# Patient Record
Sex: Male | Born: 1995 | Race: White | Hispanic: No | Marital: Single | Smoking: Current every day smoker
Health system: Southern US, Community
[De-identification: ages and names within clinical notes are randomized; demographics above are authoritative.]

## PROBLEM LIST (undated history)

## (undated) DIAGNOSIS — F329 Major depressive disorder, single episode, unspecified: Secondary | ICD-10-CM

## (undated) DIAGNOSIS — F909 Attention-deficit hyperactivity disorder, unspecified type: Secondary | ICD-10-CM

## (undated) DIAGNOSIS — F32A Depression, unspecified: Secondary | ICD-10-CM

## (undated) DIAGNOSIS — F913 Oppositional defiant disorder: Secondary | ICD-10-CM

## (undated) HISTORY — DX: Major depressive disorder, single episode, unspecified: F32.9

## (undated) HISTORY — DX: Depression, unspecified: F32.A

## (undated) HISTORY — DX: Attention-deficit hyperactivity disorder, unspecified type: F90.9

## (undated) HISTORY — DX: Oppositional defiant disorder: F91.3

---
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
## 2016-06-29 ENCOUNTER — Encounter (HOSPITAL_COMMUNITY): Payer: Self-pay | Admitting: Emergency Medicine

## 2016-06-29 ENCOUNTER — Emergency Department (HOSPITAL_COMMUNITY)
Admission: EM | Admit: 2016-06-29 | Discharge: 2016-06-29 | Disposition: A | Discharge status: Home / Self Care | Payer: Self-pay | Attending: Emergency Medicine | Admitting: Emergency Medicine

## 2016-06-29 DIAGNOSIS — F17200 Nicotine dependence, unspecified, uncomplicated: Secondary | ICD-10-CM | POA: Insufficient documentation

## 2016-06-29 DIAGNOSIS — R112 Nausea with vomiting, unspecified: Secondary | ICD-10-CM | POA: Insufficient documentation

## 2016-06-29 DIAGNOSIS — E860 Dehydration: Secondary | ICD-10-CM | POA: Insufficient documentation

## 2016-06-29 LAB — URINALYSIS, ROUTINE W REFLEX MICROSCOPIC
BILIRUBIN URINE: NEGATIVE
GLUCOSE, UA: NEGATIVE mg/dL
Hgb urine dipstick: NEGATIVE
KETONES UR: NEGATIVE mg/dL
LEUKOCYTES UA: NEGATIVE
NITRITE: NEGATIVE
Protein, ur: NEGATIVE mg/dL
SPECIFIC GRAVITY, URINE: 1.026 (ref 1.005–1.030)
pH: 5 (ref 5.0–8.0)

## 2016-06-29 LAB — COMPREHENSIVE METABOLIC PANEL
ALBUMIN: 4 g/dL (ref 3.5–5.0)
ALK PHOS: 54 U/L (ref 38–126)
ALT: 11 U/L — AB (ref 17–63)
AST: 14 U/L — AB (ref 15–41)
Anion gap: 7 (ref 5–15)
BILIRUBIN TOTAL: 0.6 mg/dL (ref 0.3–1.2)
BUN: 13 mg/dL (ref 6–20)
CALCIUM: 9.1 mg/dL (ref 8.9–10.3)
CO2: 28 mmol/L (ref 22–32)
Chloride: 103 mmol/L (ref 101–111)
Creatinine, Ser: 0.88 mg/dL (ref 0.61–1.24)
GFR calc Af Amer: >60 mL/min (ref >60)
GFR calc non Af Amer: >60 mL/min (ref >60)
Glucose, Bld: 95 mg/dL (ref 65–99)
Potassium: 3.5 mmol/L (ref 3.5–5.1)
SODIUM: 138 mmol/L (ref 135–145)
TOTAL PROTEIN: 6.2 g/dL — AB (ref 6.5–8.1)

## 2016-06-29 LAB — CBC WITH DIFFERENTIAL/PLATELET
BASOS ABS: 0 10*3/uL (ref 0.0–0.1)
BASOS PCT: 1 %
EOS ABS: 0.2 10*3/uL (ref 0.0–0.7)
Eosinophils Relative: 3 %
HEMATOCRIT: 42.9 % (ref 39.0–52.0)
HEMOGLOBIN: 14.9 g/dL (ref 13.0–17.0)
Lymphocytes Relative: 34 %
Lymphs Abs: 2.3 10*3/uL (ref 0.7–4.0)
MCH: 29 pg (ref 26.0–34.0)
MCHC: 34.7 g/dL (ref 30.0–36.0)
MCV: 83.5 fL (ref 78.0–100.0)
Monocytes Absolute: 0.7 10*3/uL (ref 0.1–1.0)
Monocytes Relative: 10 %
NEUTROS ABS: 3.6 10*3/uL (ref 1.7–7.7)
NEUTROS PCT: 52 %
Platelets: 282 10*3/uL (ref 150–400)
RBC: 5.14 MIL/uL (ref 4.22–5.81)
RDW: 13.1 % (ref 11.5–15.5)
WBC: 6.8 10*3/uL (ref 4.0–10.5)

## 2016-06-29 LAB — LIPASE, BLOOD: Lipase: 19 U/L (ref 11–51)

## 2016-06-29 MED ORDER — DIPHENHYDRAMINE HCL 50 MG/ML IJ SOLN
25.0000 mg | Freq: Once | INTRAMUSCULAR | Status: AC
  Administered 2016-06-29: 25 mg via INTRAVENOUS
  Filled 2016-06-29: qty 1

## 2016-06-29 MED ORDER — HYDROXYZINE HCL 25 MG PO TABS: 25.0000 mg | ORAL_TABLET | Freq: Once | ORAL | Status: DC

## 2016-06-29 MED ORDER — ONDANSETRON HCL 4 MG PO TABS: 4.0000 mg | ORAL_TABLET | Freq: Three times a day (TID) | ORAL | 0 refills | Status: DC | PRN

## 2016-06-29 MED ORDER — LACTATED RINGERS IV BOLUS (SEPSIS)
1000.0000 mL | Freq: Once | INTRAVENOUS | Status: AC
  Administered 2016-06-29: 1000 mL via INTRAVENOUS

## 2016-06-29 MED ORDER — PROMETHAZINE HCL 25 MG/ML IJ SOLN
25.0000 mg | Freq: Once | INTRAMUSCULAR | Status: AC
  Administered 2016-06-29: 25 mg via INTRAVENOUS
  Filled 2016-06-29: qty 1

## 2016-06-29 NOTE — ED Provider Notes (Signed)
[facility] Provider Note   CSN: [id number] Arrival date & time: 06/29/16  [id number]     History   Chief Complaint Chief Complaint  Patient presents with  . Emesis    HPI [name] [name] is a 21 y.o. male.   Emesis   This is a new problem. The current episode started yesterday. The problem occurs 5 to 10 times per day. The problem has not changed since onset.The emesis has an appearance of stomach contents. There has been no fever. Associated symptoms include abdominal pain (after vomiting). Pertinent negatives include no chills and no fever.    Past Medical History:  Diagnosis Date  . ADHD (attention deficit hyperactivity disorder)   . Depression   . Oppositional defiant disorder     Patient Active Problem List   Diagnosis Date Noted  . ADHD (attention deficit hyperactivity disorder), combined type 01/27/2011  . Unspecified episodic mood disorder 01/27/2011  . ODD (oppositional defiant disorder) 01/27/2011    History reviewed. No pertinent surgical history.     Home Medications    Prior to Admission medications   Medication Sig Start Date End Date Taking? Authorizing Provider  ibuprofen (ADVIL,MOTRIN) 400 MG tablet Take 400 mg by mouth every 6 (six) hours as needed for headache.   Yes [provider]  amphetamine-dextroamphetamine (ADDERALL XR) 30 MG 24 hr capsule Take 1 capsule (30 mg total) by mouth daily. 04/28/11 04/27/12  Nelly RoutKumar, Archana, MD  ARIPiprazole (ABILIFY) 5 MG tablet Take 1 tablet (5 mg total) by mouth daily. Patient not taking: Reported on 06/29/2016 04/28/11   Nelly RoutKumar, Archana, MD  ondansetron [facility](ZOFRAN) 4 MG tablet Take 1 tablet (4 mg total) by mouth every 8 (eight) hours as needed for nausea or vomiting. 06/29/16   Cleston Lautner, Barbara CowerJason, MD    Family History Family History  Problem Relation Age of Onset  . Depression Mother   . Bipolar disorder Father   . ADD / ADHD Brother   . Depression Maternal Grandmother     Social History Social  History  Substance Use Topics  . Smoking status: Current Every Day Smoker    Types: Cigars  . Smokeless tobacco: Never Used  . Alcohol use No     Allergies   Patient has no known allergies.   Review of Systems Review of Systems  Constitutional: Negative for chills and fever.  Gastrointestinal: Positive for abdominal pain (after vomiting) and vomiting.  All other systems reviewed and are negative.    Physical Exam Updated Vital Signs BP (!) 107/92   Pulse 67   Temp 97.7 F (36.5 C) (Oral)   Resp 18   Ht 5\' 10"  (1.778 m)   Wt 68 kg (150 lb)   SpO2 100%   BMI 21.52 kg/m   Physical Exam  Constitutional: He is oriented to person, place, and time. He appears well-developed and well-nourished.  HENT:  Head: Normocephalic and atraumatic.  Eyes: Conjunctivae and EOM are normal.  Neck: Normal range of motion.  Cardiovascular: Normal rate.   Pulmonary/Chest: Effort normal. No respiratory distress.  Abdominal: Soft. He exhibits no distension. There is no tenderness. There is no rebound and no guarding.  Musculoskeletal: Normal range of motion.  Neurological: He is alert and oriented to person, place, and time. No cranial nerve deficit. Coordination normal.  Skin: Skin is warm and dry.  Nursing note and vitals reviewed.    ED Treatments / Results  Labs (all labs ordered are listed, but only abnormal results are displayed) Labs  Reviewed  COMPREHENSIVE METABOLIC PANEL - Abnormal; Notable for the following:       Result Value   Total Protein 6.2 (*)    AST 14 (*)    ALT 11 (*)    All other components within normal limits  CBC WITH DIFFERENTIAL/PLATELET  LIPASE, BLOOD  URINALYSIS, ROUTINE W REFLEX MICROSCOPIC    EKG  EKG Interpretation None       Radiology No results found.  Procedures Procedures (including critical care time)  Medications Ordered in ED Medications  lactated ringers bolus 1,000 mL (0 mLs Intravenous Stopped 06/29/16 1014)  promethazine  (PHENERGAN) injection 25 mg (25 mg Intravenous Given 06/29/16 0743)  diphenhydrAMINE (BENADRYL) injection 25 mg (25 mg Intravenous Given 06/29/16 0818)  diphenhydrAMINE (BENADRYL) injection 25 mg (25 mg Intravenous Given 06/29/16 0848)     Initial Impression / Assessment and Plan / ED Course  I have reviewed the triage vital signs and the nursing notes.  Pertinent labs & imaging results that were available during my care of the patient were reviewed by me and considered in my medical decision making (see chart for details).    Just vomiting but longer than 24 hours so will check screening labs, give fluids, anti-emetics. Low suspicion for surgical causes at this time, abdomen benign.  Patient with akathisia like reaction to phenergan, improved with benadryl. Tired on reeval after that but symptoms better. Nausea improved. Abdominal pain subsided and exam still non focal and non surgical. Stable for dc.   Final Clinical Impressions(s) / ED Diagnoses   Final diagnoses:  Non-intractable vomiting with nausea, unspecified vomiting type  Dehydration    New Prescriptions New Prescriptions   ONDANSETRON (ZOFRAN) 4 MG TABLET    Take 1 tablet (4 mg total) by mouth every 8 (eight) hours as needed for nausea or vomiting.     Marily Memos, MD 06/29/16 1048

## 2016-06-29 NOTE — ED Notes (Signed)
Pt still feeling "jittery", Dr. Clayborne DanaMesner informed and gave verbal order for 25mg  benadryl IV.

## 2016-06-29 NOTE — ED Triage Notes (Signed)
Pt reports n/v since yesterday morning.  Has not been able to keep anything down.

## 2016-06-29 NOTE — ED Notes (Signed)
Pt states he feels "trapped, restless, and claustrophobic", MD notified.  Pt is using urinal at this time.

## 2016-06-29 NOTE — ED Notes (Addendum)
Pt made aware to return if symptoms worsen or if any life threatening symptoms occur.  Notified Dr. Clayborne DanaMesner that pt pulse dipped in the 40's, is 62 at this time and bp 101/65. Dr. Clayborne DanaMesner okay with d/c vitals at this time.

## 2016-06-29 NOTE — ED Notes (Signed)
Pt sleeping at this time.

## 2016-08-18 ENCOUNTER — Emergency Department (HOSPITAL_COMMUNITY)
Admission: EM | Admit: 2016-08-18 | Discharge: 2016-08-18 | Disposition: A | Discharge status: Home / Self Care | Payer: Self-pay | Attending: Emergency Medicine | Admitting: Emergency Medicine

## 2016-08-18 ENCOUNTER — Encounter (HOSPITAL_COMMUNITY): Payer: Self-pay | Admitting: Cardiology

## 2016-08-18 DIAGNOSIS — J069 Acute upper respiratory infection, unspecified: Secondary | ICD-10-CM | POA: Insufficient documentation

## 2016-08-18 DIAGNOSIS — Z79899 Other long term (current) drug therapy: Secondary | ICD-10-CM | POA: Insufficient documentation

## 2016-08-18 DIAGNOSIS — F17290 Nicotine dependence, other tobacco product, uncomplicated: Secondary | ICD-10-CM | POA: Insufficient documentation

## 2016-08-18 DIAGNOSIS — J029 Acute pharyngitis, unspecified: Secondary | ICD-10-CM | POA: Insufficient documentation

## 2016-08-18 MED ORDER — IBUPROFEN 800 MG PO TABS
800.0000 mg | ORAL_TABLET | Freq: Once | ORAL | Status: AC
  Administered 2016-08-18: 800 mg via ORAL
  Filled 2016-08-18: qty 1

## 2016-08-18 MED ORDER — AMOXICILLIN 500 MG PO CAPS: 500.0000 mg | ORAL_CAPSULE | Freq: Three times a day (TID) | ORAL | 0 refills | Status: DC

## 2016-08-18 MED ORDER — AMOXICILLIN 250 MG PO CAPS
500.0000 mg | ORAL_CAPSULE | Freq: Once | ORAL | Status: AC
  Administered 2016-08-18: 500 mg via ORAL
  Filled 2016-08-18: qty 2

## 2016-08-18 NOTE — ED Provider Notes (Signed)
[facility] Provider Note   CSN: [id number] Arrival date & time: 08/18/16  1840     History   Chief Complaint Chief Complaint  Patient presents with  . Sore Throat    HPI [name] [name] is a 21 y.o. male.  Patient is a 21 year old male who presents to the emergency department with a complaint of sore throat.  The patient states this problem started this morning. He has had some head congestion during the day and he has also had body aches during the day. He has not measured a temperature elevation. There's been no diarrhea or vomiting. No unusual rash reported.      Past Medical History:  Diagnosis Date  . ADHD (attention deficit hyperactivity disorder)   . Depression   . Oppositional defiant disorder     Patient Active Problem List   Diagnosis Date Noted  . ADHD (attention deficit hyperactivity disorder), combined type 01/27/2011  . Unspecified episodic mood disorder 01/27/2011  . ODD (oppositional defiant disorder) 01/27/2011    History reviewed. No pertinent surgical history.     Home Medications    Prior to Admission medications   Medication Sig Start Date End Date Taking? Authorizing Provider  amphetamine-dextroamphetamine (ADDERALL XR) 30 MG 24 hr capsule Take 1 capsule (30 mg total) by mouth daily. 04/28/11 04/27/12  Nelly RoutKumar, Archana, MD  ARIPiprazole (ABILIFY) 5 MG tablet Take 1 tablet (5 mg total) by mouth daily. Patient not taking: Reported on 06/29/2016 04/28/11   Nelly RoutKumar, Archana, MD  ibuprofen (ADVIL,MOTRIN) 400 MG tablet Take 400 mg by mouth every 6 (six) hours as needed for headache.    [provider]  ondansetron (ZOFRAN) 4 MG tablet Take 1 tablet (4 mg total) by mouth every 8 (eight) hours as needed for nausea or vomiting. 06/29/16   Mesner, Barbara CowerJason, MD    Family History Family History  Problem Relation Age of Onset  . Depression Mother   . Bipolar disorder Father   . ADD / ADHD Brother   . Depression Maternal Grandmother      Social History Social History  Substance Use Topics  . Smoking status: Current Every Day Smoker    Types: Cigars  . Smokeless tobacco: Never Used  . Alcohol use No     Allergies   Promethazine hcl   Review of Systems Review of Systems  Constitutional: Positive for chills. Negative for activity change.       All ROS Neg except as noted in HPI  HENT: Positive for congestion and sore throat. Negative for nosebleeds.   Eyes: Negative for photophobia and discharge.  Respiratory: Negative for cough, shortness of breath and wheezing.   Cardiovascular: Negative for chest pain and palpitations.  Gastrointestinal: Negative for abdominal pain and blood in stool.  Genitourinary: Negative for dysuria, frequency and hematuria.  Musculoskeletal: Positive for myalgias. Negative for arthralgias, back pain and neck pain.  Skin: Negative.   Neurological: Negative for dizziness, seizures and speech difficulty.  Psychiatric/Behavioral: Negative for confusion and hallucinations.     Physical Exam Updated Vital Signs BP 133/76   Pulse (!) 104   Temp 98.8 F (37.1 C) (Oral)   Resp 16   Ht 5\' 10"  (1.778 m)   Wt 61.2 kg (135 lb)   SpO2 98%   BMI 19.37 kg/m   Physical Exam  Constitutional: He is oriented to person, place, and time. He appears well-developed and well-nourished.  Non-toxic appearance.  HENT:  Head: Normocephalic.  Right Ear: Tympanic membrane and external  ear normal.  Left Ear: Tympanic membrane and external ear normal.  There is increased redness of the posterior pharynx. There is increased redness of the uvula. Uvula is enlarged. The airway is patent.  There is nasal congestion present.  Eyes: Pupils are equal, round, and reactive to light. EOM and lids are normal.  Neck: Normal range of motion. Neck supple. Carotid bruit is not present.  Cardiovascular: Regular rhythm, normal heart sounds, intact distal pulses and normal pulses.  Tachycardia present.    Pulmonary/Chest: Breath sounds normal. No respiratory distress.  Abdominal: Soft. Bowel sounds are normal. There is no tenderness. There is no guarding.  Musculoskeletal: Normal range of motion.  Lymphadenopathy:       Head (right side): No submandibular adenopathy present.       Head (left side): No submandibular adenopathy present.    He has no cervical adenopathy.  Neurological: He is alert and oriented to person, place, and time. He has normal strength. No cranial nerve deficit or sensory deficit.  Skin: Skin is warm and dry. No rash noted.  Psychiatric: He has a normal mood and affect. His speech is normal.  Nursing note and vitals reviewed.    ED Treatments / Results  Labs (all labs ordered are listed, but only abnormal results are displayed) Labs Reviewed - No data to display  EKG  EKG Interpretation None       Radiology No results found.  Procedures Procedures (including critical care time)  Medications Ordered in ED Medications  amoxicillin (AMOXIL) capsule 500 mg (not administered)  ibuprofen (ADVIL,MOTRIN) tablet 800 mg (not administered)     Initial Impression / Assessment and Plan / ED Course  I have reviewed the triage vital signs and the nursing notes.  Pertinent labs & imaging results that were available during my care of the patient were reviewed by me and considered in my medical decision making (see chart for details).      Final Clinical Impressions(s) / ED Diagnoses MDM Vital signs reviewed. Hent the patient has increased redness of the posterior pharynx. Enlargement of the uvula is noted. The patient will be treated with Amoxil and ibuprofen. I've advised the patient on the importance of good handwashing, good hydration. I've also instructed the patient to see his primary physician, or return to the emergency department if his symptoms are worsening. Patient etiology is understanding of the discharge instructions.    Final diagnoses:   Pharyngitis, unspecified etiology  Upper respiratory tract infection, unspecified type    New Prescriptions New Prescriptions   No medications on file     Ivery Quale, Cordelia Poche 08/18/16 1927    Samuel Jester, DO 08/20/16 1726

## 2016-08-18 NOTE — Discharge Instructions (Signed)
Salt water gargles will be helpful of 3 or 4 times daily. May use Chloraseptic spray if needed for assistance with your discomfort. Please use Amoxil 3 times daily with food until all taken. Use 600 mg of ibuprofen with breakfast, lunch, dinner, and at bedtime for fever, and aching. Wash hands frequently. Please keep your distance from others, and do not share eating utensils as this can be contagious.

## 2016-08-18 NOTE — ED Triage Notes (Signed)
Sore throat since this morning. Head congestion.

## 2016-10-13 ENCOUNTER — Encounter (HOSPITAL_COMMUNITY): Payer: Self-pay | Admitting: *Deleted

## 2016-10-13 ENCOUNTER — Emergency Department (HOSPITAL_COMMUNITY): Payer: Self-pay

## 2016-10-13 ENCOUNTER — Emergency Department (HOSPITAL_COMMUNITY)
Admission: EM | Admit: 2016-10-13 | Discharge: 2016-10-13 | Disposition: A | Discharge status: Home / Self Care | Payer: Self-pay | Attending: Emergency Medicine | Admitting: Emergency Medicine

## 2016-10-13 DIAGNOSIS — G43009 Migraine without aura, not intractable, without status migrainosus: Secondary | ICD-10-CM | POA: Insufficient documentation

## 2016-10-13 DIAGNOSIS — Z79899 Other long term (current) drug therapy: Secondary | ICD-10-CM | POA: Insufficient documentation

## 2016-10-13 DIAGNOSIS — F17290 Nicotine dependence, other tobacco product, uncomplicated: Secondary | ICD-10-CM | POA: Insufficient documentation

## 2016-10-13 DIAGNOSIS — F909 Attention-deficit hyperactivity disorder, unspecified type: Secondary | ICD-10-CM | POA: Insufficient documentation

## 2016-10-13 MED ORDER — METOCLOPRAMIDE HCL 5 MG/ML IJ SOLN
10.0000 mg | Freq: Once | INTRAMUSCULAR | Status: AC
  Administered 2016-10-13: 10 mg via INTRAVENOUS
  Filled 2016-10-13: qty 2

## 2016-10-13 MED ORDER — SODIUM CHLORIDE 0.9 % IV BOLUS (SEPSIS)
1000.0000 mL | Freq: Once | INTRAVENOUS | Status: AC
  Administered 2016-10-13: 1000 mL via INTRAVENOUS

## 2016-10-13 MED ORDER — METOCLOPRAMIDE HCL 10 MG PO TABS: 10.0000 mg | ORAL_TABLET | Freq: Four times a day (QID) | ORAL | 0 refills | Status: DC | PRN

## 2016-10-13 MED ORDER — DIPHENHYDRAMINE HCL 50 MG/ML IJ SOLN
25.0000 mg | Freq: Once | INTRAMUSCULAR | Status: AC
  Administered 2016-10-13: 25 mg via INTRAVENOUS
  Filled 2016-10-13: qty 1

## 2016-10-13 NOTE — ED Provider Notes (Signed)
[facility] Provider Note   CSN: [id number] Arrival date & time: 10/13/16  0114     History   Chief Complaint Chief Complaint  Patient presents with  . Headache    HPI [name] [name] is a 21 y.o. male.  The history is provided by the patient.  Headache     He had onset about noon of a bitemporal headache,. Pain is sharp and throbbing he rates at 7/10. Pain is worse with exposure to light, better if he stays in the dark. There was no blurring of vision. There was nausea and vomiting. He tried taking a dose of acetaminophen, but vomited after taking it. He denies weakness, numbness, tingling. He has had similar headaches in the past, and they have been coming more frequently over the last 6 months. He is worried because a sibling had a brain tumor.  Past Medical History:  Diagnosis Date  . ADHD (attention deficit hyperactivity disorder)   . Depression   . Oppositional defiant disorder     Patient Active Problem List   Diagnosis Date Noted  . ADHD (attention deficit hyperactivity disorder), combined type 01/27/2011  . Unspecified episodic mood disorder 01/27/2011  . ODD (oppositional defiant disorder) 01/27/2011    History reviewed. No pertinent surgical history.     Home Medications    Prior to Admission medications   Medication Sig Start Date End Date Taking? Authorizing Provider  amphetamine-dextroamphetamine (ADDERALL XR) 30 MG 24 hr capsule Take 1 capsule (30 mg total) by mouth daily. 04/28/11 04/27/12  Nelly Rout, MD  ARIPiprazole (ABILIFY) 5 MG tablet Take 1 tablet (5 mg total) by mouth daily. Patient not taking: Reported on 06/29/2016 04/28/11   Nelly Rout, MD  ibuprofen (ADVIL,MOTRIN) 400 MG tablet Take 400 mg by mouth every 6 (six) hours as needed for headache.    [provider]  ondansetron (ZOFRAN) 4 MG tablet Take 1 tablet (4 mg total) by mouth every 8 (eight) hours as needed for nausea or vomiting. 06/29/16   Mesner, Barbara Cower, MD     Family History Family History  Problem Relation Age of Onset  . Depression Mother   . Bipolar disorder Father   . ADD / ADHD Brother   . Depression Maternal Grandmother     Social History Social History  Substance Use Topics  . Smoking status: Current Every Day Smoker    Types: Cigars  . Smokeless tobacco: Never Used  . Alcohol use Yes     Comment: occasionally     Allergies   Promethazine hcl and Vicodin [hydrocodone-acetaminophen]   Review of Systems Review of Systems  Neurological: Positive for headaches.  All other systems reviewed and are negative.    Physical Exam Updated Vital Signs BP (!) 132/92 (BP Location: Right Arm)   Pulse 66   Temp 97.9 F (36.6 C) (Oral)   Resp 18   Ht  (1.778 m)   Wt 63.5 kg (140 lb)   SpO2 100%   BMI 20.09 kg/m   Physical Exam  Nursing note and vitals reviewed.  21 year old male, resting comfortably and in no acute distress. Vital signs are significant for borderline hypertension. Oxygen saturation is 100%, which is normal. Head is normocephalic and atraumatic. PERRLA, EOMI. Fundi show no hemorrhage, exudate, or papilledema. Oropharynx is clear. Neck is nontender and supple without adenopathy or JVD. Back is nontender and there is no CVA tenderness. Lungs are clear without rales, wheezes, or rhonchi. Chest is nontender. Heart has regular  rate and rhythm without murmur. Abdomen is soft, flat, nontender without masses or hepatosplenomegaly and peristalsis is normoactive. Extremities have no cyanosis or edema, full range of motion is present. Skin is warm and dry without rash. Neurologic: Mental status is normal, cranial nerves are intact, there are no motor or sensory deficits.  ED Treatments / Results   Radiology Ct Head Wo Contrast  Result Date: 10/13/2016 CLINICAL DATA:  Acute onset headache and vomiting, 1 hour duration. EXAM: CT HEAD WITHOUT CONTRAST TECHNIQUE: Contiguous axial images were obtained from  the base of the skull through the vertex without intravenous contrast. COMPARISON:  None. FINDINGS: Brain: There is no intracranial hemorrhage, mass or evidence of acute infarction. There is no extra-axial fluid collection. Gray matter and white matter appear normal. Cerebral volume is normal for age. Brainstem and posterior fossa are unremarkable. The CSF spaces appear normal. Vascular: No hyperdense vessel or unexpected calcification. Skull: Normal. Negative for fracture or focal lesion. Sinuses/Orbits: No acute finding. Other: None. IMPRESSION: Normal brain Electronically Signed   By: Ellery Plunk M.D.   On: 10/13/2016 02:17    Procedures Procedures (including critical care time)  Medications Ordered in ED Medications  sodium chloride 0.9 % bolus 1,000 mL (0 mLs Intravenous Stopped 10/13/16 0311)  metoCLOPramide (REGLAN) injection 10 mg (10 mg Intravenous Given 10/13/16 0218)  diphenhydrAMINE (BENADRYL) injection 25 mg (25 mg Intravenous Given 10/13/16 0219)     Initial Impression / Assessment and Plan / ED Course  I have reviewed the triage vital signs and the nursing notes.  Pertinent imaging results that were available during my care of the patient were reviewed by me and considered in my medical decision making (see chart for details).  Headache which seems to be a migraine variant. No advice to suggest more serious causes of headache. However, given increasing frequency of headaches and family history of brain tumors, will send for CT of head. He is given normal saline, metoclopramide, diphenhydramine. Old records are reviewed, and he has no relevant past visits.  He had excellent relief with above noted treatment. CT was normal. He is discharged with prescription for metoclopramide.  Final Clinical Impressions(s) / ED Diagnoses   Final diagnoses:  Migraine without aura and without status migrainosus, not intractable    New Prescriptions New Prescriptions   METOCLOPRAMIDE  (REGLAN) 10 MG TABLET    Take 1 tablet (10 mg total) by mouth every 6 (six) hours as needed for nausea (or headache).     Dione Booze, MD 10/13/16 [phone]

## 2016-10-13 NOTE — ED Triage Notes (Signed)
Pt c/o headache with one episode of vomiting that started one hour pta

## 2016-12-07 ENCOUNTER — Emergency Department (HOSPITAL_COMMUNITY)
Admission: EM | Admit: 2016-12-07 | Discharge: 2016-12-07 | Disposition: A | Discharge status: Home / Self Care | Payer: Self-pay | Attending: Emergency Medicine | Admitting: Emergency Medicine

## 2016-12-07 ENCOUNTER — Emergency Department (HOSPITAL_COMMUNITY): Payer: Self-pay

## 2016-12-07 ENCOUNTER — Encounter (HOSPITAL_COMMUNITY): Payer: Self-pay | Admitting: Emergency Medicine

## 2016-12-07 ENCOUNTER — Other Ambulatory Visit: Payer: Self-pay

## 2016-12-07 DIAGNOSIS — R197 Diarrhea, unspecified: Secondary | ICD-10-CM | POA: Insufficient documentation

## 2016-12-07 DIAGNOSIS — F17290 Nicotine dependence, other tobacco product, uncomplicated: Secondary | ICD-10-CM | POA: Insufficient documentation

## 2016-12-07 DIAGNOSIS — E860 Dehydration: Secondary | ICD-10-CM | POA: Insufficient documentation

## 2016-12-07 DIAGNOSIS — R112 Nausea with vomiting, unspecified: Secondary | ICD-10-CM | POA: Insufficient documentation

## 2016-12-07 LAB — CBC WITH DIFFERENTIAL/PLATELET
BASOS ABS: 0 10*3/uL (ref 0.0–0.1)
BASOS PCT: 0 %
Basophils Absolute: 0 10*3/uL (ref 0.0–0.1)
Basophils Relative: 0 %
EOS PCT: 0 %
EOS PCT: 0 %
Eosinophils Absolute: 0 10*3/uL (ref 0.0–0.7)
Eosinophils Absolute: 0 10*3/uL (ref 0.0–0.7)
HCT: 43.1 % (ref 39.0–52.0)
HEMATOCRIT: 51.3 % (ref 39.0–52.0)
HEMOGLOBIN: 14.3 g/dL (ref 13.0–17.0)
Hemoglobin: 17.3 g/dL — ABNORMAL HIGH (ref 13.0–17.0)
LYMPHS ABS: 0.4 10*3/uL — AB (ref 0.7–4.0)
LYMPHS PCT: 2 %
Lymphocytes Relative: 3 %
Lymphs Abs: 0.6 10*3/uL — ABNORMAL LOW (ref 0.7–4.0)
MCH: 29.3 pg (ref 26.0–34.0)
MCH: 28.8 pg (ref 26.0–34.0)
MCHC: 33.7 g/dL (ref 30.0–36.0)
MCHC: 33.2 g/dL (ref 30.0–36.0)
MCV: 86.8 fL (ref 78.0–100.0)
MCV: 86.7 fL (ref 78.0–100.0)
MONO ABS: 0.8 10*3/uL (ref 0.1–1.0)
MONO ABS: 1 10*3/uL (ref 0.1–1.0)
MONOS PCT: 4 %
MONOS PCT: 6 %
Neutro Abs: 19.8 10*3/uL — ABNORMAL HIGH (ref 1.7–7.7)
Neutro Abs: 15.1 10*3/uL — ABNORMAL HIGH (ref 1.7–7.7)
Neutrophils Relative %: 94 %
Neutrophils Relative %: 91 %
PLATELETS: 292 10*3/uL (ref 150–400)
Platelets: 263 10*3/uL (ref 150–400)
RBC: 5.91 MIL/uL — ABNORMAL HIGH (ref 4.22–5.81)
RBC: 4.97 MIL/uL (ref 4.22–5.81)
RDW: 13 % (ref 11.5–15.5)
RDW: 13.1 % (ref 11.5–15.5)
WBC: 21.1 10*3/uL — ABNORMAL HIGH (ref 4.0–10.5)
WBC: 16.6 10*3/uL — ABNORMAL HIGH (ref 4.0–10.5)

## 2016-12-07 LAB — COMPREHENSIVE METABOLIC PANEL
ALBUMIN: 5.2 g/dL — AB (ref 3.5–5.0)
ALT: 14 U/L — ABNORMAL LOW (ref 17–63)
AST: 18 U/L (ref 15–41)
Alkaline Phosphatase: 62 U/L (ref 38–126)
Anion gap: 8 (ref 5–15)
BILIRUBIN TOTAL: 1.4 mg/dL — AB (ref 0.3–1.2)
BUN: 19 mg/dL (ref 6–20)
CHLORIDE: 105 mmol/L (ref 101–111)
CO2: 29 mmol/L (ref 22–32)
Calcium: 10.2 mg/dL (ref 8.9–10.3)
Creatinine, Ser: 0.92 mg/dL (ref 0.61–1.24)
GFR calc Af Amer: >60 mL/min (ref >60)
GFR calc non Af Amer: >60 mL/min (ref >60)
GLUCOSE: 122 mg/dL — AB (ref 65–99)
POTASSIUM: 4.4 mmol/L (ref 3.5–5.1)
Sodium: 142 mmol/L (ref 135–145)
TOTAL PROTEIN: 7.8 g/dL (ref 6.5–8.1)

## 2016-12-07 LAB — URINALYSIS, ROUTINE W REFLEX MICROSCOPIC
Bilirubin Urine: NEGATIVE
GLUCOSE, UA: NEGATIVE mg/dL
HGB URINE DIPSTICK: NEGATIVE
Ketones, ur: NEGATIVE mg/dL
Leukocytes, UA: NEGATIVE
Nitrite: NEGATIVE
PH: 6 (ref 5.0–8.0)
Protein, ur: NEGATIVE mg/dL

## 2016-12-07 LAB — LIPASE, BLOOD: Lipase: 22 U/L (ref 11–51)

## 2016-12-07 MED ORDER — ONDANSETRON 4 MG PO TBDP: 4.0000 mg | ORAL_TABLET | Freq: Three times a day (TID) | ORAL | 0 refills | Status: AC | PRN

## 2016-12-07 MED ORDER — SODIUM CHLORIDE 0.9 % IV BOLUS (SEPSIS)
1000.0000 mL | Freq: Once | INTRAVENOUS | Status: AC
  Administered 2016-12-07: 1000 mL via INTRAVENOUS

## 2016-12-07 MED ORDER — FAMOTIDINE IN NACL 20-0.9 MG/50ML-% IV SOLN
20.0000 mg | Freq: Once | INTRAVENOUS | Status: AC
  Administered 2016-12-07: 20 mg via INTRAVENOUS
  Filled 2016-12-07: qty 50

## 2016-12-07 MED ORDER — IOPAMIDOL (ISOVUE-300) INJECTION 61%
100.0000 mL | Freq: Once | INTRAVENOUS | Status: AC | PRN
Start: 2016-12-07 — End: 2016-12-07
  Administered 2016-12-07: 100 mL via INTRAVENOUS

## 2016-12-07 MED ORDER — ONDANSETRON HCL 4 MG/2ML IJ SOLN
4.0000 mg | INTRAMUSCULAR | Status: DC | PRN
  Administered 2016-12-07: 4 mg via INTRAVENOUS
  Filled 2016-12-07: qty 2

## 2016-12-07 NOTE — ED Triage Notes (Signed)
PT states generalized abdominal pain with n/v/d that started at 0700 today.

## 2016-12-07 NOTE — ED Notes (Signed)
Patient tolerating fluid. 

## 2016-12-07 NOTE — ED Provider Notes (Signed)
[facility] EMERGENCY DEPARTMENT Provider Note   CSN: [id number] Arrival date & time: 12/07/16  1259     History   Chief Complaint Chief Complaint  Patient presents with  . Emesis    HPI [name] [name] is a 21 y.o. male.  HPI  Pt was seen at 1435. Per pt, c/o gradual onset and persistence of multiple intermittent episodes of N/V/D that began last night. Has been associated with generalized abd pain and body aches.  Describes the stools as "watery." Denies CP/SOB, no back pain, no fevers, no black or blood in stools or emesis, no dysuria/hematuria, no testicular pain/swelling.    Past Medical History:  Diagnosis Date  . ADHD (attention deficit hyperactivity disorder)   . Depression   . Oppositional defiant disorder     Patient Active Problem List   Diagnosis Date Noted  . ADHD (attention deficit hyperactivity disorder), combined type 01/27/2011  . Unspecified episodic mood disorder 01/27/2011  . ODD (oppositional defiant disorder) 01/27/2011    History reviewed. No pertinent surgical history.     Home Medications    Prior to Admission medications   Not on File    Family History Family History  Problem Relation Age of Onset  . Depression Mother   . Bipolar disorder Father   . ADD / ADHD Brother   . Depression Maternal Grandmother     Social History Social History   Tobacco Use  . Smoking status: Current Every Day Smoker    Types: Cigars  . Smokeless tobacco: Never Used  Substance Use Topics  . Alcohol use: Yes    Comment: occasionally  . Drug use: No     Allergies   Promethazine hcl and Vicodin [hydrocodone-acetaminophen]   Review of Systems Review of Systems ROS: Statement: All systems negative except as marked or noted in the HPI; Constitutional: Negative for fever and chills. +generalized body aches.; ; Eyes: Negative for eye pain, redness and discharge. ; ; ENMT: Negative for ear pain, hoarseness, nasal congestion, sinus pressure and  sore throat. ; ; Cardiovascular: Negative for chest pain, palpitations, diaphoresis, dyspnea and peripheral edema. ; ; Respiratory: Negative for cough, wheezing and stridor. ; ; Gastrointestinal: +N/V/D, abd pain. Negative for blood in stool, hematemesis, jaundice and rectal bleeding. . ; ; Genitourinary: Negative for dysuria, flank pain and hematuria. ; ; Genital:  No penile drainage or rash, no testicular pain or swelling, no scrotal rash or swelling. ;; Musculoskeletal: Negative for back pain and neck pain. Negative for swelling and trauma.; ; Skin: Negative for pruritus, rash, abrasions, blisters, bruising and skin lesion.; ; Neuro: Negative for headache, lightheadedness and neck stiffness. Negative for weakness, altered level of consciousness, altered mental status, extremity weakness, paresthesias, involuntary movement, seizure and syncope.       Physical Exam Updated Vital Signs BP 104/75   Pulse 91   Temp 98.8 F (37.1 C) (Oral)   Resp 18   Ht 5\' 10"  (1.778 m)   Wt 63.5 kg (140 lb)   SpO2 100%   BMI 20.09 kg/m   Physical Exam 1440: Physical examination:  Nursing notes reviewed; Vital signs and O2 SAT reviewed;  Constitutional: Well developed, Well nourished, In no acute distress; Head:  Normocephalic, atraumatic; Eyes: EOMI, PERRL, No scleral icterus; ENMT: Mouth and pharynx normal, Mucous membranes dry; Neck: Supple, Full range of motion, No lymphadenopathy; Cardiovascular: Regular rate and rhythm, No gallop; Respiratory: Breath sounds clear & equal bilaterally, No wheezes.  Speaking full sentences with ease, Normal  respiratory effort/excursion; Chest: Nontender, Movement normal; Abdomen: Soft, +diffuse tenderness to palp. Nondistended, Normal bowel sounds; Genitourinary: No CVA tenderness; Extremities: Pulses normal, No tenderness, No edema, No calf edema or asymmetry.; Neuro: AA&Ox3, Major CN grossly intact.  Speech clear. No gross focal motor or sensory deficits in extremities. Climbs  on and off stretcher easily by himself. Gait steady..; Skin: Color normal, Warm, Dry.   ED Treatments / Results  Labs (all labs ordered are listed, but only abnormal results are displayed)   EKG  EKG Interpretation None       Radiology   Procedures Procedures (including critical care time)  Medications Ordered in ED Medications  ondansetron (ZOFRAN) injection 4 mg (4 mg Intravenous Given 12/07/16 1510)  famotidine (PEPCID) IVPB 20 mg premix (20 mg Intravenous New Bag/Given 12/07/16 1510)  sodium chloride 0.9 % bolus 1,000 mL (1,000 mLs Intravenous New Bag/Given 12/07/16 1509)     Initial Impression / Assessment and Plan / ED Course  I have reviewed the triage vital signs and the nursing notes.  Pertinent labs & imaging results that were available during my care of the patient were reviewed by me and considered in my medical decision making (see chart for details).  MDM Reviewed: previous chart, nursing note and vitals Reviewed previous: labs Interpretation: labs and CT scan    Results for orders placed or performed during the hospital encounter of 12/07/16  Lipase, blood  Result Value Ref Range   Lipase 22 11 - 51 U/L  Comprehensive metabolic panel  Result Value Ref Range   Sodium 142 135 - 145 mmol/L   Potassium 4.4 3.5 - 5.1 mmol/L   Chloride 105 101 - 111 mmol/L   CO2 29 22 - 32 mmol/L   Glucose, Bld 122 (H) 65 - 99 mg/dL   BUN 19 6 - 20 mg/dL   Creatinine, Ser 6.960.92 0.61 - 1.24 mg/dL   Calcium 29.510.2 8.9 - 28.410.3 mg/dL   Total Protein 7.8 6.5 - 8.1 g/dL   Albumin 5.2 (H) 3.5 - 5.0 g/dL   AST 18 15 - 41 U/L   ALT 14 (L) 17 - 63 U/L   Alkaline Phosphatase 62 38 - 126 U/L   Total Bilirubin 1.4 (H) 0.3 - 1.2 mg/dL   GFR calc non Af Amer >60 >60 mL/min   GFR calc Af Amer >60 >60 mL/min   Anion gap 8 5 - 15  Urinalysis, Routine w reflex microscopic  Result Value Ref Range   Color, Urine YELLOW YELLOW   APPearance CLEAR CLEAR   Specific Gravity, Urine >1.046  (H) 1.005 - 1.030   pH 6.0 5.0 - 8.0   Glucose, UA NEGATIVE NEGATIVE mg/dL   Hgb urine dipstick NEGATIVE NEGATIVE   Bilirubin Urine NEGATIVE NEGATIVE   Ketones, ur NEGATIVE NEGATIVE mg/dL   Protein, ur NEGATIVE NEGATIVE mg/dL   Nitrite NEGATIVE NEGATIVE   Leukocytes, UA NEGATIVE NEGATIVE  CBC with Differential  Result Value Ref Range   WBC 21.1 (H) 4.0 - 10.5 K/uL   RBC 5.91 (H) 4.22 - 5.81 MIL/uL   Hemoglobin 17.3 (H) 13.0 - 17.0 g/dL   HCT 13.251.3 44.039.0 - 10.252.0 %   MCV 86.8 78.0 - 100.0 fL   MCH 29.3 26.0 - 34.0 pg   MCHC 33.7 30.0 - 36.0 g/dL   RDW 72.513.0 36.611.5 - 44.015.5 %   Platelets 292 150 - 400 K/uL   Neutrophils Relative % 94 %   Neutro Abs 19.8 (H) 1.7 - 7.7 K/uL  Lymphocytes Relative 2 %   Lymphs Abs 0.4 (L) 0.7 - 4.0 K/uL   Monocytes Relative 4 %   Monocytes Absolute 0.8 0.1 - 1.0 K/uL   Eosinophils Relative 0 %   Eosinophils Absolute 0.0 0.0 - 0.7 K/uL   Basophils Relative 0 %   Basophils Absolute 0.0 0.0 - 0.1 K/uL  CBC with Differential  Result Value Ref Range   WBC 16.6 (H) 4.0 - 10.5 K/uL   RBC 4.97 4.22 - 5.81 MIL/uL   Hemoglobin 14.3 13.0 - 17.0 g/dL   HCT 04.543.1 40.939.0 - 81.152.0 %   MCV 86.7 78.0 - 100.0 fL   MCH 28.8 26.0 - 34.0 pg   MCHC 33.2 30.0 - 36.0 g/dL   RDW 91.413.1 78.211.5 - 95.615.5 %   Platelets 263 150 - 400 K/uL   Neutrophils Relative % 91 %   Neutro Abs 15.1 (H) 1.7 - 7.7 K/uL   Lymphocytes Relative 3 %   Lymphs Abs 0.6 (L) 0.7 - 4.0 K/uL   Monocytes Relative 6 %   Monocytes Absolute 1.0 0.1 - 1.0 K/uL   Eosinophils Relative 0 %   Eosinophils Absolute 0.0 0.0 - 0.7 K/uL   Basophils Relative 0 %   Basophils Absolute 0.0 0.0 - 0.1 K/uL   Dg Chest 2 View Result Date: 12/07/2016 CLINICAL DATA:  Nausea and vomiting.  No chest complaints. EXAM: CHEST  2 VIEW COMPARISON:  Chest x-ray report dated December 17, 2015. FINDINGS: The heart size and mediastinal contours are within normal limits. Both lungs are clear. The visualized skeletal structures are unremarkable.  IMPRESSION: No active cardiopulmonary disease. Electronically Signed   By: Obie DredgeWilliam T Derry M.D.   On: 12/07/2016 17:02   [place] Abdomen Complete Result Date: 12/07/2016 CLINICAL DATA:  Nausea, vomiting, and diarrhea for 1 day. EXAM: ABDOMEN ULTRASOUND COMPLETE COMPARISON:  CT abdomen pelvis from same day. FINDINGS: Gallbladder: No gallstones or wall thickening visualized. No sonographic Murphy sign noted by sonographer. Common bile duct: Diameter: 1 mm, normal. Liver: No focal lesion identified. Within normal limits in parenchymal echogenicity. Portal vein is patent on color Doppler imaging with normal direction of blood flow towards the liver. IVC: No abnormality visualized. Pancreas: Visualized portion unremarkable. Spleen: Size and appearance within normal limits. Right Kidney: Length: 10.7 cm. Echogenicity within normal limits. No mass or hydronephrosis visualized. Left Kidney: Length: 11.2 cm. Echogenicity within normal limits. No mass or hydronephrosis visualized. Abdominal aorta: No aneurysm visualized. Other findings: None. IMPRESSION: Normal abdominal ultrasound. Electronically Signed   By: Obie DredgeWilliam T Derry M.D.   On: 12/07/2016 16:46   Ct Abdomen Pelvis W Contrast Result Date: 12/07/2016 CLINICAL DATA:  Acute generalized abdominal pain. EXAM: CT ABDOMEN AND PELVIS WITH CONTRAST TECHNIQUE: Multidetector CT imaging of the abdomen and pelvis was performed using the standard protocol following bolus administration of intravenous contrast. CONTRAST:  100mL ISOVUE-300 IOPAMIDOL (ISOVUE-300) INJECTION 61% COMPARISON:  None. FINDINGS: Lower chest: No acute abnormality. Hepatobiliary: No focal liver abnormality is seen. No gallstones, gallbladder wall thickening, or biliary dilatation. Pancreas: Unremarkable. No pancreatic ductal dilatation or surrounding inflammatory changes. Spleen: Normal in size without focal abnormality. Adrenals/Urinary Tract: Adrenal glands are unremarkable. Kidneys are normal, without  renal calculi, focal lesion, or hydronephrosis. Bladder is unremarkable. Stomach/Bowel: Stomach is within normal limits. Appendix appears normal. No evidence of bowel wall thickening, distention, or inflammatory changes. Vascular/Lymphatic: No significant vascular findings are present. No enlarged abdominal or pelvic lymph nodes. Reproductive: Prostate is unremarkable. Other: No abdominal wall hernia or abnormality.  No abdominopelvic ascites. Musculoskeletal: No acute or significant osseous findings. IMPRESSION: No abnormality seen in the abdomen or pelvis. Electronically Signed   By: Lupita Raider, M.D.   On: 12/07/2016 16:03    1940:  WBC count and H/H initially elevated, likely hemconcentration as both improved after IVF boluses. Pt has urinated while in the ED. Pt has ambulated with steady gait, easy resps, NAD. Pt has tol PO well while in the ED without N/V.  No stooling while in the ED.  Abd benign, VSS. Feels better and wants to go home now. Tx symptomatically at this time. Dx and testing d/w pt and family.  Questions answered.  Verb understanding, agreeable to d/c home with outpt f/u.    Final Clinical Impressions(s) / ED Diagnoses   Final diagnoses:  None    ED Discharge Orders    None       Samuel Jester, DO 12/10/16 0825

## 2016-12-07 NOTE — ED Notes (Signed)
Patient returned from CT

## 2016-12-07 NOTE — Discharge Instructions (Signed)
Take the prescription as directed.  Increase your fluid intake (ie:  Gatoraide) for the next few days, as discussed.  Eat a bland diet and advance to your regular diet slowly as you can tolerate it.   Avoid full strength juices, as well as milk and milk products until your diarrhea has resolved.   Call your regular medical doctor tomorrow to schedule a follow up appointment this week.  Return to the Emergency Department immediately if not improving (or even worsening) despite taking the medicines as prescribed, any black or bloody stool or vomit, if you develop a fever over "101," or for any other concerns.

## 2017-02-22 ENCOUNTER — Emergency Department (HOSPITAL_COMMUNITY)
Admission: EM | Admit: 2017-02-22 | Discharge: 2017-02-22 | Disposition: A | Discharge status: Home / Self Care | Payer: Self-pay | Attending: Emergency Medicine | Admitting: Emergency Medicine

## 2017-02-22 ENCOUNTER — Encounter (HOSPITAL_COMMUNITY): Payer: Self-pay | Admitting: Emergency Medicine

## 2017-02-22 DIAGNOSIS — J029 Acute pharyngitis, unspecified: Secondary | ICD-10-CM | POA: Insufficient documentation

## 2017-02-22 DIAGNOSIS — J3489 Other specified disorders of nose and nasal sinuses: Secondary | ICD-10-CM | POA: Insufficient documentation

## 2017-02-22 DIAGNOSIS — R05 Cough: Secondary | ICD-10-CM | POA: Insufficient documentation

## 2017-02-22 DIAGNOSIS — H6690 Otitis media, unspecified, unspecified ear: Secondary | ICD-10-CM | POA: Insufficient documentation

## 2017-02-22 DIAGNOSIS — R059 Cough, unspecified: Secondary | ICD-10-CM

## 2017-02-22 DIAGNOSIS — F17210 Nicotine dependence, cigarettes, uncomplicated: Secondary | ICD-10-CM | POA: Insufficient documentation

## 2017-02-22 MED ORDER — AMOXICILLIN 500 MG PO CAPS: 500.0000 mg | ORAL_CAPSULE | Freq: Three times a day (TID) | ORAL | 0 refills | Status: AC

## 2017-02-22 NOTE — ED Provider Notes (Signed)
[facility] EMERGENCY DEPARTMENT Provider Note   CSN: [id number] Arrival date & time: 02/22/17  [id number]     History   Chief Complaint Chief Complaint  Patient presents with  . Cough    HPI [name] [name] is a 22 y.o. male.   Cough  This is a new problem. The current episode started more than 2 days ago. The problem occurs constantly. The problem has been gradually worsening. The cough is non-productive. Maximum temperature: subjective. Associated symptoms include chills, ear congestion, ear pain, rhinorrhea, sore throat and myalgias. Pertinent negatives include no chest pain. He has tried nothing for the symptoms. The treatment provided no relief.    Past Medical History:  Diagnosis Date  . ADHD (attention deficit hyperactivity disorder)   . Depression   . Oppositional defiant disorder     Patient Active Problem List   Diagnosis Date Noted  . ADHD (attention deficit hyperactivity disorder), combined type 01/27/2011  . Unspecified episodic mood disorder 01/27/2011  . ODD (oppositional defiant disorder) 01/27/2011    History reviewed. No pertinent surgical history.     Home Medications    Prior to Admission medications   Medication Sig Start Date End Date Taking? Authorizing Provider  amoxicillin (AMOXIL) 500 MG capsule Take 1 capsule (500 mg total) by mouth 3 (three) times daily. 02/22/17   Nara Paternoster, Barbara CowerJason, MD  ondansetron (ZOFRAN ODT) 4 MG disintegrating tablet Take 1 tablet (4 mg total) by mouth every 8 (eight) hours as needed for nausea or vomiting. 12/07/16   Samuel JesterMcManus, Kathleen, DO    Family History Family History  Problem Relation Age of Onset  . Depression Mother   . Bipolar disorder Father   . ADD / ADHD Brother   . Depression Maternal Grandmother     Social History Social History   Tobacco Use  . Smoking status: Current Every Day Smoker    Packs/day: 0.50    Types: Cigarettes  . Smokeless tobacco: Never Used  Substance Use Topics  . Alcohol use:  Yes    Comment: occasionally  . Drug use: No     Allergies   Promethazine hcl and Vicodin [hydrocodone-acetaminophen]   Review of Systems Review of Systems  Constitutional: Positive for chills.  HENT: Positive for congestion, ear pain, postnasal drip, rhinorrhea, sinus pain, sneezing and sore throat.   Respiratory: Positive for cough.   Cardiovascular: Negative for chest pain.  Musculoskeletal: Positive for myalgias.  All other systems reviewed and are negative.    Physical Exam Updated Vital Signs BP 128/76 (BP Location: Right Arm)   Pulse 65   Temp 97.8 F (36.6 C) (Oral)   Resp 18   Ht 5\' 10"  (1.778 m)   Wt 56.7 kg (125 lb)   SpO2 100%   BMI 17.94 kg/m   Physical Exam  Constitutional: He is oriented to person, place, and time. He appears well-developed and well-nourished.  HENT:  Head: Normocephalic and atraumatic.  Right Ear: There is swelling. Tympanic membrane is injected, scarred, erythematous and bulging. A middle ear effusion is present. Decreased hearing is noted.  Left Ear: Tympanic membrane is scarred.  Eyes: Conjunctivae and EOM are normal.  Neck: Normal range of motion.  Cardiovascular: Normal rate and regular rhythm.  Pulmonary/Chest: Effort normal. No respiratory distress.  Abdominal: Soft. He exhibits no distension.  Musculoskeletal: Normal range of motion.  Neurological: He is alert and oriented to person, place, and time. No cranial nerve deficit. Coordination normal.  Skin: Skin is warm and dry.  Nursing note and vitals reviewed.    ED Treatments / Results  Labs (all labs ordered are listed, but only abnormal results are displayed) Labs Reviewed - No data to display  EKG  EKG Interpretation None       Radiology No results found.  Procedures Procedures (including critical care time)  Medications Ordered in ED Medications - No data to display   Initial Impression / Assessment and Plan / ED Course  I have reviewed the triage  vital signs and the nursing notes.  Pertinent labs & imaging results that were available during my care of the patient were reviewed by me and considered in my medical decision making (see chart for details).     Likely viral URi that turned into a R AOM. Possibly influenza but has been >48 hours since onset of illness so not a candidate for treatment anyway, will forego further testing. Abx/ENT follow up if hearing not improving.   Final Clinical Impressions(s) / ED Diagnoses   Final diagnoses:  Cough  Ear infection    ED Discharge Orders        Ordered    amoxicillin (AMOXIL) 500 MG capsule  3 times daily     02/22/17 0743       Burgess Sheriff, Barbara Cower, MD 02/22/17 [phone]

## 2017-02-22 NOTE — ED Triage Notes (Signed)
Pt reports cough, congestion and right ear pain since Friday.  Girlfriend also sick.

## 2019-07-10 DIAGNOSIS — Z20822 Contact with and (suspected) exposure to covid-19: Secondary | ICD-10-CM | POA: Diagnosis not present

## 2019-07-10 DIAGNOSIS — R5383 Other fatigue: Secondary | ICD-10-CM | POA: Diagnosis not present

## 2019-07-10 DIAGNOSIS — Z79899 Other long term (current) drug therapy: Secondary | ICD-10-CM | POA: Diagnosis not present

## 2019-07-10 DIAGNOSIS — L508 Other urticaria: Secondary | ICD-10-CM | POA: Diagnosis not present

## 2019-08-09 DIAGNOSIS — L501 Idiopathic urticaria: Secondary | ICD-10-CM | POA: Diagnosis not present

## 2019-08-23 DIAGNOSIS — J302 Other seasonal allergic rhinitis: Secondary | ICD-10-CM | POA: Diagnosis not present

## 2019-08-23 DIAGNOSIS — L501 Idiopathic urticaria: Secondary | ICD-10-CM | POA: Diagnosis not present

## 2019-10-25 DIAGNOSIS — F6381 Intermittent explosive disorder: Secondary | ICD-10-CM | POA: Diagnosis not present

## 2019-11-08 DIAGNOSIS — F6381 Intermittent explosive disorder: Secondary | ICD-10-CM | POA: Diagnosis not present

## 2019-11-13 ENCOUNTER — Ambulatory Visit: Payer: Medicaid Other | Admitting: Allergy

## 2019-11-13 NOTE — Progress Notes (Deleted)
New Patient Note  RE: [name] MRN: [id number] DOB: January 12, 1995 Date of Office Visit: 11/13/2019  Referring provider: Joette Catching, MD Primary care provider: Joette Catching, MD  Chief Complaint: No chief complaint on file.  History of Present Illness: I had the pleasure of seeing [name] [name] for initial evaluation at the [facility] on 11/13/2019. He is a 24 y.o. male, who is referred here by Joette Catching, MD for the evaluation of ***.  ***  Assessment and Plan: Saron is a 24 y.o. male with: No problem-specific Assessment & Plan notes found for this encounter.  No follow-ups on file.  No orders of the defined types were placed in this encounter.  Lab Orders  No laboratory test(s) ordered today    Other allergy screening: Asthma: {Blank single:19197::"yes","no"} Rhino conjunctivitis: {Blank single:19197::"yes","no"} Food allergy: {Blank single:19197::"yes","no"} Medication allergy: {Blank single:19197::"yes","no"} Hymenoptera allergy: {Blank single:19197::"yes","no"} Urticaria: {Blank single:19197::"yes","no"} Eczema:{Blank single:19197::"yes","no"} History of recurrent infections suggestive of immunodeficency: {Blank single:19197::"yes","no"}  Diagnostics: Spirometry:  Tracings reviewed. His effort: {Blank single:19197::"Good reproducible efforts.","It was hard to get consistent efforts and there is a question as to whether this reflects a maximal maneuver.","Poor effort, data can not be interpreted."} FVC: ***L FEV1: ***L, ***% predicted FEV1/FVC ratio: ***% Interpretation: {Blank single:19197::"Spirometry consistent with mild obstructive disease","Spirometry consistent with moderate obstructive disease","Spirometry consistent with severe obstructive disease","Spirometry consistent with possible restrictive disease","Spirometry consistent with mixed obstructive and restrictive disease","Spirometry uninterpretable due to  technique","Spirometry consistent with normal pattern","No overt abnormalities noted given today's efforts"}.  Please see scanned spirometry results for details.  Skin Testing: {Blank single:19197::"Select foods","Environmental allergy panel","Environmental allergy panel and select foods","Food allergy panel","None","Deferred due to recent antihistamines use"}. Positive test to: ***. Negative test to: ***.  Results discussed with patient/family.   Past Medical History: Patient Active Problem List   Diagnosis Date Noted  . ADHD (attention deficit hyperactivity disorder), combined type 01/27/2011  . Unspecified episodic mood disorder 01/27/2011  . ODD (oppositional defiant disorder) 01/27/2011   Past Medical History:  Diagnosis Date  . ADHD (attention deficit hyperactivity disorder)   . Depression   . Oppositional defiant disorder    Past Surgical History: No past surgical history on file. Medication List:  Current Outpatient Medications  Medication Sig Dispense Refill  . amoxicillin (AMOXIL) 500 MG capsule Take 1 capsule (500 mg total) by mouth 3 (three) times daily. 21 capsule 0  . ondansetron (ZOFRAN ODT) 4 MG disintegrating tablet Take 1 tablet (4 mg total) by mouth every 8 (eight) hours as needed for nausea or vomiting. 6 tablet 0   No current facility-administered medications for this visit.   Allergies: Allergies  Allergen Reactions  . Promethazine Hcl Other (See Comments)    Anxiety, feeling restless, muscle cramping  . Vicodin [Hydrocodone-Acetaminophen]    Social History: Social History   Socioeconomic History  . Marital status: Single    Spouse name: Not on file  . Number of children: Not on file  . Years of education: Not on file  . Highest education level: Not on file  Occupational History  . Not on file  Tobacco Use  . Smoking status: Current Every Day Smoker    Packs/day: 0.50    Types: Cigarettes  . Smokeless tobacco: Never Used  Vaping Use  .  Vaping Use: Never used  Substance and Sexual Activity  . Alcohol use: Yes    Comment: occasionally  . Drug use: No    Types: Marijuana  . Sexual activity: Never  Other Topics Concern  . Not on file  Social History Narrative  . Not on file   Social Determinants of Health   Financial Resource Strain:   . Difficulty of Paying Living Expenses: Not on file  Food Insecurity:   . Worried About Programme researcher, broadcasting/film/video in the Last Year: Not on file  . Ran Out of Food in the Last Year: Not on file  Transportation Needs:   . Lack of Transportation (Medical): Not on file  . Lack of Transportation (Non-Medical): Not on file  Physical Activity:   . Days of Exercise per Week: Not on file  . Minutes of Exercise per Session: Not on file  Stress:   . Feeling of Stress : Not on file  Social Connections:   . Frequency of Communication with Friends and Family: Not on file  . Frequency of Social Gatherings with Friends and Family: Not on file  . Attends Religious Services: Not on file  . Active Member of Clubs or Organizations: Not on file  . Attends Banker Meetings: Not on file  . Marital Status: Not on file   Lives in a ***. Smoking: *** Occupation: ***  Environmental HistorySurveyor, minerals in the house: Copywriter, advertising in the family room: {Blank single:19197::"yes","no"} Carpet in the bedroom: {Blank single:19197::"yes","no"} Heating: {Blank single:19197::"electric","gas"} Cooling: {Blank single:19197::"central","window"} Pet: {Blank single:19197::"yes ***","no"}  Family History: Family History  Problem Relation Age of Onset  . Depression Mother   . Bipolar disorder Father   . ADD / ADHD Brother   . Depression Maternal Grandmother    Problem                               Relation Asthma                                   *** Eczema                                *** Food allergy                          *** Allergic rhino conjunctivitis      ***  Review of Systems  Constitutional: Negative for appetite change, chills, fever and unexpected weight change.  HENT: Negative for congestion and rhinorrhea.   Eyes: Negative for itching.  Respiratory: Negative for cough, chest tightness, shortness of breath and wheezing.   Cardiovascular: Negative for chest pain.  Gastrointestinal: Negative for abdominal pain.  Genitourinary: Negative for difficulty urinating.  Skin: Negative for rash.  Neurological: Negative for headaches.   Objective: There were no vitals taken for this visit. There is no height or weight on file to calculate BMI. Physical Exam Vitals and nursing note reviewed.  Constitutional:      Appearance: Normal appearance. He is well-developed.  HENT:     Head: Normocephalic and atraumatic.     Right Ear: External ear normal.     Left Ear: External ear normal.     Nose: Nose normal.     Mouth/Throat:     Mouth: Mucous membranes are moist.     Pharynx: Oropharynx is clear.  Eyes:     Conjunctiva/sclera: Conjunctivae normal.  Cardiovascular:     Rate and Rhythm: Normal  rate and regular rhythm.     Heart sounds: Normal heart sounds. No murmur heard.  No friction rub. No gallop.   Pulmonary:     Effort: Pulmonary effort is normal.     Breath sounds: Normal breath sounds. No wheezing, rhonchi or rales.  Abdominal:     Palpations: Abdomen is soft.  Musculoskeletal:     Cervical back: Neck supple.  Skin:    General: Skin is warm.     Findings: No rash.  Neurological:     Mental Status: He is alert and oriented to person, place, and time.  Psychiatric:        Behavior: Behavior normal.    The plan was reviewed with the patient/family, and all questions/concerned were addressed.  It was my pleasure to see [name] [name] today and participate in his care. Please feel free to contact me with any questions or concerns.  Sincerely,  Wyline Mood, DO Allergy & Immunology  [facility] office: [phone] [facility] office: [phone]

## 2019-11-14 DIAGNOSIS — J3089 Other allergic rhinitis: Secondary | ICD-10-CM | POA: Diagnosis not present

## 2019-11-14 DIAGNOSIS — J302 Other seasonal allergic rhinitis: Secondary | ICD-10-CM | POA: Diagnosis not present

## 2019-11-21 DIAGNOSIS — J302 Other seasonal allergic rhinitis: Secondary | ICD-10-CM | POA: Diagnosis not present

## 2019-11-21 DIAGNOSIS — J3089 Other allergic rhinitis: Secondary | ICD-10-CM | POA: Diagnosis not present

## 2019-11-28 DIAGNOSIS — J302 Other seasonal allergic rhinitis: Secondary | ICD-10-CM | POA: Diagnosis not present

## 2019-11-28 DIAGNOSIS — J3089 Other allergic rhinitis: Secondary | ICD-10-CM | POA: Diagnosis not present

## 2019-12-05 DIAGNOSIS — J3089 Other allergic rhinitis: Secondary | ICD-10-CM | POA: Diagnosis not present

## 2019-12-05 DIAGNOSIS — J302 Other seasonal allergic rhinitis: Secondary | ICD-10-CM | POA: Diagnosis not present

## 2019-12-06 DIAGNOSIS — F6381 Intermittent explosive disorder: Secondary | ICD-10-CM | POA: Diagnosis not present

## 2019-12-12 DIAGNOSIS — Z20822 Contact with and (suspected) exposure to covid-19: Secondary | ICD-10-CM | POA: Diagnosis not present

## 2019-12-18 DIAGNOSIS — F6381 Intermittent explosive disorder: Secondary | ICD-10-CM | POA: Diagnosis not present

## 2019-12-26 DIAGNOSIS — J3089 Other allergic rhinitis: Secondary | ICD-10-CM | POA: Diagnosis not present

## 2019-12-26 DIAGNOSIS — J302 Other seasonal allergic rhinitis: Secondary | ICD-10-CM | POA: Diagnosis not present

## 2020-01-02 DIAGNOSIS — J3089 Other allergic rhinitis: Secondary | ICD-10-CM | POA: Diagnosis not present

## 2020-01-02 DIAGNOSIS — J302 Other seasonal allergic rhinitis: Secondary | ICD-10-CM | POA: Diagnosis not present

## 2020-01-08 DIAGNOSIS — J309 Allergic rhinitis, unspecified: Secondary | ICD-10-CM | POA: Diagnosis not present

## 2020-01-09 DIAGNOSIS — J3089 Other allergic rhinitis: Secondary | ICD-10-CM | POA: Diagnosis not present

## 2020-01-09 DIAGNOSIS — J302 Other seasonal allergic rhinitis: Secondary | ICD-10-CM | POA: Diagnosis not present

## 2020-01-16 DIAGNOSIS — J3089 Other allergic rhinitis: Secondary | ICD-10-CM | POA: Diagnosis not present

## 2020-01-16 DIAGNOSIS — J302 Other seasonal allergic rhinitis: Secondary | ICD-10-CM | POA: Diagnosis not present

## 2020-01-30 DIAGNOSIS — J3089 Other allergic rhinitis: Secondary | ICD-10-CM | POA: Diagnosis not present

## 2020-01-30 DIAGNOSIS — J302 Other seasonal allergic rhinitis: Secondary | ICD-10-CM | POA: Diagnosis not present

## 2020-02-06 DIAGNOSIS — J3089 Other allergic rhinitis: Secondary | ICD-10-CM | POA: Diagnosis not present

## 2020-02-06 DIAGNOSIS — J302 Other seasonal allergic rhinitis: Secondary | ICD-10-CM | POA: Diagnosis not present

## 2020-02-08 DIAGNOSIS — J309 Allergic rhinitis, unspecified: Secondary | ICD-10-CM | POA: Diagnosis not present

## 2020-02-15 DIAGNOSIS — R42 Dizziness and giddiness: Secondary | ICD-10-CM | POA: Diagnosis not present

## 2020-02-15 DIAGNOSIS — H9011 Conductive hearing loss, unilateral, right ear, with unrestricted hearing on the contralateral side: Secondary | ICD-10-CM | POA: Diagnosis not present

## 2020-02-16 ENCOUNTER — Encounter (HOSPITAL_COMMUNITY): Payer: Self-pay | Admitting: Emergency Medicine

## 2020-02-16 ENCOUNTER — Emergency Department (HOSPITAL_COMMUNITY)
Admission: EM | Admit: 2020-02-16 | Discharge: 2020-02-17 | Disposition: A | Discharge status: Home / Self Care | Payer: Medicaid Other | Attending: Emergency Medicine | Admitting: Emergency Medicine

## 2020-02-16 ENCOUNTER — Emergency Department (HOSPITAL_COMMUNITY): Payer: Medicaid Other

## 2020-02-16 ENCOUNTER — Other Ambulatory Visit: Payer: Self-pay

## 2020-02-16 DIAGNOSIS — Z041 Encounter for examination and observation following transport accident: Secondary | ICD-10-CM | POA: Diagnosis not present

## 2020-02-16 DIAGNOSIS — R519 Headache, unspecified: Secondary | ICD-10-CM | POA: Insufficient documentation

## 2020-02-16 DIAGNOSIS — F17210 Nicotine dependence, cigarettes, uncomplicated: Secondary | ICD-10-CM | POA: Insufficient documentation

## 2020-02-16 DIAGNOSIS — Y92410 Unspecified street and highway as the place of occurrence of the external cause: Secondary | ICD-10-CM | POA: Diagnosis not present

## 2020-02-16 DIAGNOSIS — S39012A Strain of muscle, fascia and tendon of lower back, initial encounter: Secondary | ICD-10-CM | POA: Insufficient documentation

## 2020-02-16 DIAGNOSIS — S0012XA Contusion of left eyelid and periocular area, initial encounter: Secondary | ICD-10-CM | POA: Diagnosis not present

## 2020-02-16 DIAGNOSIS — H5712 Ocular pain, left eye: Secondary | ICD-10-CM | POA: Insufficient documentation

## 2020-02-16 DIAGNOSIS — R079 Chest pain, unspecified: Secondary | ICD-10-CM | POA: Diagnosis not present

## 2020-02-16 DIAGNOSIS — S3992XA Unspecified injury of lower back, initial encounter: Secondary | ICD-10-CM | POA: Diagnosis present

## 2020-02-16 DIAGNOSIS — R1012 Left upper quadrant pain: Secondary | ICD-10-CM | POA: Diagnosis not present

## 2020-02-16 DIAGNOSIS — M549 Dorsalgia, unspecified: Secondary | ICD-10-CM | POA: Diagnosis not present

## 2020-02-16 MED ORDER — FLUORESCEIN SODIUM 1 MG OP STRP
1.0000 | ORAL_STRIP | Freq: Once | OPHTHALMIC | Status: AC
  Administered 2020-02-16: 1 via OPHTHALMIC
  Filled 2020-02-16: qty 1

## 2020-02-16 MED ORDER — TETRACAINE HCL 0.5 % OP SOLN
2.0000 [drp] | Freq: Once | OPHTHALMIC | Status: AC
  Administered 2020-02-16: 2 [drp] via OPHTHALMIC
  Filled 2020-02-16: qty 4
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
## 2020-02-17 ENCOUNTER — Other Ambulatory Visit (HOSPITAL_COMMUNITY): Payer: Medicaid Other

## 2020-02-17 ENCOUNTER — Emergency Department (HOSPITAL_COMMUNITY): Payer: Medicaid Other

## 2020-02-17 DIAGNOSIS — Z041 Encounter for examination and observation following transport accident: Secondary | ICD-10-CM | POA: Diagnosis not present

## 2020-02-17 DIAGNOSIS — R079 Chest pain, unspecified: Secondary | ICD-10-CM | POA: Diagnosis not present

## 2020-02-17 DIAGNOSIS — M549 Dorsalgia, unspecified: Secondary | ICD-10-CM | POA: Diagnosis not present

## 2020-02-17 LAB — URINALYSIS, ROUTINE W REFLEX MICROSCOPIC
Bilirubin Urine: NEGATIVE
Glucose, UA: NEGATIVE mg/dL
Hgb urine dipstick: NEGATIVE
Ketones, ur: NEGATIVE mg/dL
Leukocytes,Ua: NEGATIVE
Nitrite: NEGATIVE
Protein, ur: NEGATIVE mg/dL
Specific Gravity, Urine: 1.019 (ref 1.005–1.030)
pH: 6 (ref 5.0–8.0)

## 2020-02-17 MED ORDER — IOHEXOL 300 MG/ML  SOLN
100.0000 mL | Freq: Once | INTRAMUSCULAR | Status: AC | PRN
  Administered 2020-02-17: 100 mL via INTRAVENOUS

## 2020-02-17 MED ORDER — NAPROXEN 500 MG PO TABS: 500.0000 mg | ORAL_TABLET | Freq: Two times a day (BID) | ORAL | 0 refills | Status: AC | PRN

## 2020-02-17 MED ORDER — ERYTHROMYCIN 5 MG/GM OP OINT: TOPICAL_OINTMENT | OPHTHALMIC | 0 refills | Status: AC
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
## 2020-02-19 DIAGNOSIS — S76312A Strain of muscle, fascia and tendon of the posterior muscle group at thigh level, left thigh, initial encounter: Secondary | ICD-10-CM | POA: Diagnosis not present

## 2020-02-20 DIAGNOSIS — J302 Other seasonal allergic rhinitis: Secondary | ICD-10-CM | POA: Diagnosis not present

## 2020-02-20 DIAGNOSIS — J3089 Other allergic rhinitis: Secondary | ICD-10-CM | POA: Diagnosis not present

## 2020-02-25 DIAGNOSIS — F6381 Intermittent explosive disorder: Secondary | ICD-10-CM | POA: Diagnosis not present

## 2020-02-27 DIAGNOSIS — J3089 Other allergic rhinitis: Secondary | ICD-10-CM | POA: Diagnosis not present

## 2020-02-27 DIAGNOSIS — J302 Other seasonal allergic rhinitis: Secondary | ICD-10-CM | POA: Diagnosis not present

## 2020-03-08 DIAGNOSIS — R569 Unspecified convulsions: Secondary | ICD-10-CM | POA: Diagnosis not present

## 2020-03-08 DIAGNOSIS — R253 Fasciculation: Secondary | ICD-10-CM | POA: Diagnosis not present

## 2020-03-08 DIAGNOSIS — R5383 Other fatigue: Secondary | ICD-10-CM | POA: Diagnosis not present

## 2020-03-08 DIAGNOSIS — R231 Pallor: Secondary | ICD-10-CM | POA: Diagnosis not present

## 2020-03-19 DIAGNOSIS — F6381 Intermittent explosive disorder: Secondary | ICD-10-CM | POA: Diagnosis not present

## 2020-03-19 DIAGNOSIS — J302 Other seasonal allergic rhinitis: Secondary | ICD-10-CM | POA: Diagnosis not present

## 2020-03-19 DIAGNOSIS — J3089 Other allergic rhinitis: Secondary | ICD-10-CM | POA: Diagnosis not present

## 2020-03-19 DIAGNOSIS — R55 Syncope and collapse: Secondary | ICD-10-CM | POA: Diagnosis not present

## 2020-03-26 DIAGNOSIS — F6381 Intermittent explosive disorder: Secondary | ICD-10-CM | POA: Diagnosis not present

## 2020-04-02 DIAGNOSIS — R0981 Nasal congestion: Secondary | ICD-10-CM | POA: Diagnosis not present

## 2020-04-02 DIAGNOSIS — J101 Influenza due to other identified influenza virus with other respiratory manifestations: Secondary | ICD-10-CM | POA: Diagnosis not present

## 2020-04-02 DIAGNOSIS — J020 Streptococcal pharyngitis: Secondary | ICD-10-CM | POA: Diagnosis not present

## 2020-04-02 DIAGNOSIS — J029 Acute pharyngitis, unspecified: Secondary | ICD-10-CM | POA: Diagnosis not present

## 2020-04-09 DIAGNOSIS — J302 Other seasonal allergic rhinitis: Secondary | ICD-10-CM | POA: Diagnosis not present

## 2020-04-09 DIAGNOSIS — F6381 Intermittent explosive disorder: Secondary | ICD-10-CM | POA: Diagnosis not present

## 2020-04-09 DIAGNOSIS — J3089 Other allergic rhinitis: Secondary | ICD-10-CM | POA: Diagnosis not present

## 2020-04-10 DIAGNOSIS — R55 Syncope and collapse: Secondary | ICD-10-CM | POA: Diagnosis not present

## 2020-04-16 DIAGNOSIS — F6381 Intermittent explosive disorder: Secondary | ICD-10-CM | POA: Diagnosis not present

## 2020-04-30 DIAGNOSIS — J3089 Other allergic rhinitis: Secondary | ICD-10-CM | POA: Diagnosis not present

## 2020-04-30 DIAGNOSIS — J302 Other seasonal allergic rhinitis: Secondary | ICD-10-CM | POA: Diagnosis not present

## 2020-04-30 DIAGNOSIS — F6381 Intermittent explosive disorder: Secondary | ICD-10-CM | POA: Diagnosis not present

## 2020-05-03 DIAGNOSIS — H5213 Myopia, bilateral: Secondary | ICD-10-CM | POA: Diagnosis not present

## 2020-05-07 DIAGNOSIS — J3089 Other allergic rhinitis: Secondary | ICD-10-CM | POA: Diagnosis not present

## 2020-05-07 DIAGNOSIS — J302 Other seasonal allergic rhinitis: Secondary | ICD-10-CM | POA: Diagnosis not present

## 2020-05-07 DIAGNOSIS — F6381 Intermittent explosive disorder: Secondary | ICD-10-CM | POA: Diagnosis not present

## 2020-05-21 DIAGNOSIS — J3089 Other allergic rhinitis: Secondary | ICD-10-CM | POA: Diagnosis not present

## 2020-05-21 DIAGNOSIS — J302 Other seasonal allergic rhinitis: Secondary | ICD-10-CM | POA: Diagnosis not present

## 2020-05-28 DIAGNOSIS — J3089 Other allergic rhinitis: Secondary | ICD-10-CM | POA: Diagnosis not present

## 2020-05-28 DIAGNOSIS — F6381 Intermittent explosive disorder: Secondary | ICD-10-CM | POA: Diagnosis not present

## 2020-05-28 DIAGNOSIS — J302 Other seasonal allergic rhinitis: Secondary | ICD-10-CM | POA: Diagnosis not present

## 2020-06-12 DIAGNOSIS — F6381 Intermittent explosive disorder: Secondary | ICD-10-CM | POA: Diagnosis not present

## 2020-06-16 DIAGNOSIS — J309 Allergic rhinitis, unspecified: Secondary | ICD-10-CM | POA: Diagnosis not present

## 2020-06-26 DIAGNOSIS — J3089 Other allergic rhinitis: Secondary | ICD-10-CM | POA: Diagnosis not present

## 2020-06-26 DIAGNOSIS — J302 Other seasonal allergic rhinitis: Secondary | ICD-10-CM | POA: Diagnosis not present

## 2020-07-17 DIAGNOSIS — J309 Allergic rhinitis, unspecified: Secondary | ICD-10-CM | POA: Diagnosis not present

## 2020-12-09 DIAGNOSIS — M62830 Muscle spasm of back: Secondary | ICD-10-CM | POA: Diagnosis not present

## 2020-12-09 DIAGNOSIS — M9901 Segmental and somatic dysfunction of cervical region: Secondary | ICD-10-CM | POA: Diagnosis not present

## 2020-12-09 DIAGNOSIS — M9903 Segmental and somatic dysfunction of lumbar region: Secondary | ICD-10-CM | POA: Diagnosis not present

## 2020-12-09 DIAGNOSIS — M9902 Segmental and somatic dysfunction of thoracic region: Secondary | ICD-10-CM | POA: Diagnosis not present

## 2020-12-10 DIAGNOSIS — M9902 Segmental and somatic dysfunction of thoracic region: Secondary | ICD-10-CM | POA: Diagnosis not present

## 2020-12-10 DIAGNOSIS — M9903 Segmental and somatic dysfunction of lumbar region: Secondary | ICD-10-CM | POA: Diagnosis not present

## 2020-12-10 DIAGNOSIS — M62830 Muscle spasm of back: Secondary | ICD-10-CM | POA: Diagnosis not present

## 2020-12-10 DIAGNOSIS — M9901 Segmental and somatic dysfunction of cervical region: Secondary | ICD-10-CM | POA: Diagnosis not present

## 2020-12-15 DIAGNOSIS — M9902 Segmental and somatic dysfunction of thoracic region: Secondary | ICD-10-CM | POA: Diagnosis not present

## 2020-12-15 DIAGNOSIS — M62830 Muscle spasm of back: Secondary | ICD-10-CM | POA: Diagnosis not present

## 2020-12-15 DIAGNOSIS — M9903 Segmental and somatic dysfunction of lumbar region: Secondary | ICD-10-CM | POA: Diagnosis not present

## 2020-12-15 DIAGNOSIS — M9901 Segmental and somatic dysfunction of cervical region: Secondary | ICD-10-CM | POA: Diagnosis not present

## 2020-12-17 DIAGNOSIS — M62830 Muscle spasm of back: Secondary | ICD-10-CM | POA: Diagnosis not present

## 2020-12-17 DIAGNOSIS — M9902 Segmental and somatic dysfunction of thoracic region: Secondary | ICD-10-CM | POA: Diagnosis not present

## 2020-12-17 DIAGNOSIS — M9903 Segmental and somatic dysfunction of lumbar region: Secondary | ICD-10-CM | POA: Diagnosis not present

## 2020-12-17 DIAGNOSIS — M9901 Segmental and somatic dysfunction of cervical region: Secondary | ICD-10-CM | POA: Diagnosis not present

## 2020-12-18 DIAGNOSIS — M62830 Muscle spasm of back: Secondary | ICD-10-CM | POA: Diagnosis not present

## 2020-12-18 DIAGNOSIS — M9903 Segmental and somatic dysfunction of lumbar region: Secondary | ICD-10-CM | POA: Diagnosis not present

## 2020-12-18 DIAGNOSIS — M9902 Segmental and somatic dysfunction of thoracic region: Secondary | ICD-10-CM | POA: Diagnosis not present

## 2020-12-18 DIAGNOSIS — M9901 Segmental and somatic dysfunction of cervical region: Secondary | ICD-10-CM | POA: Diagnosis not present

## 2021-01-16 DIAGNOSIS — S0993XA Unspecified injury of face, initial encounter: Secondary | ICD-10-CM | POA: Diagnosis not present

## 2021-01-16 DIAGNOSIS — R519 Headache, unspecified: Secondary | ICD-10-CM | POA: Diagnosis not present

## 2021-02-18 DIAGNOSIS — G43009 Migraine without aura, not intractable, without status migrainosus: Secondary | ICD-10-CM | POA: Diagnosis not present

## 2021-04-15 DIAGNOSIS — K5900 Constipation, unspecified: Secondary | ICD-10-CM | POA: Diagnosis not present
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
## 2023-01-13 ENCOUNTER — Other Ambulatory Visit: Payer: Self-pay

## 2023-01-13 ENCOUNTER — Encounter (HOSPITAL_COMMUNITY): Payer: Self-pay | Admitting: Emergency Medicine

## 2023-01-13 ENCOUNTER — Emergency Department (HOSPITAL_COMMUNITY): Payer: Self-pay

## 2023-01-13 ENCOUNTER — Emergency Department (HOSPITAL_COMMUNITY)
Admission: EM | Admit: 2023-01-13 | Discharge: 2023-01-13 | Disposition: A | Discharge status: Home / Self Care | Payer: Self-pay | Attending: Emergency Medicine | Admitting: Emergency Medicine

## 2023-01-13 DIAGNOSIS — R0789 Other chest pain: Secondary | ICD-10-CM | POA: Insufficient documentation

## 2023-01-13 LAB — TROPONIN I (HIGH SENSITIVITY): Troponin I (High Sensitivity): <2 ng/L (ref <18)

## 2023-01-13 LAB — CBC
HCT: 48.8 % (ref 39.0–52.0)
Hemoglobin: 16.6 g/dL (ref 13.0–17.0)
MCH: 28.8 pg (ref 26.0–34.0)
MCHC: 34 g/dL (ref 30.0–36.0)
MCV: 84.7 fL (ref 80.0–100.0)
Platelets: 374 10*3/uL (ref 150–400)
RBC: 5.76 MIL/uL (ref 4.22–5.81)
RDW: 13.1 % (ref 11.5–15.5)
WBC: 6 10*3/uL (ref 4.0–10.5)
nRBC: 0 % (ref 0.0–0.2)

## 2023-01-13 LAB — BASIC METABOLIC PANEL
Anion gap: 7 (ref 5–15)
BUN: 19 mg/dL (ref 6–20)
CO2: 27 mmol/L (ref 22–32)
Calcium: 9.5 mg/dL (ref 8.9–10.3)
Chloride: 103 mmol/L (ref 98–111)
Creatinine, Ser: 0.89 mg/dL (ref 0.61–1.24)
GFR, Estimated: >60 mL/min (ref >60)
Glucose, Bld: 91 mg/dL (ref 70–99)
Potassium: 4 mmol/L (ref 3.5–5.1)
Sodium: 137 mmol/L (ref 135–145)

## 2023-01-13 MED ORDER — KETOROLAC TROMETHAMINE 60 MG/2ML IM SOLN
15.0000 mg | Freq: Once | INTRAMUSCULAR | Status: AC
  Administered 2023-01-13: 15 mg via INTRAMUSCULAR
  Filled 2023-01-13: qty 2

## 2023-01-13 MED ORDER — NAPROXEN 500 MG PO TABS: 500.0000 mg | ORAL_TABLET | Freq: Two times a day (BID) | ORAL | 0 refills | Status: AC

## 2023-01-13 MED ORDER — METHOCARBAMOL 500 MG PO TABS: 500.0000 mg | ORAL_TABLET | Freq: Four times a day (QID) | ORAL | 0 refills | Status: AC | PRN

## 2023-01-13 MED ORDER — LIDOCAINE 5 % EX PTCH
1.0000 | MEDICATED_PATCH | CUTANEOUS | Status: DC
Start: 2023-01-13 — End: 2023-01-13
  Filled 2023-01-13: qty 1

## 2023-01-13 NOTE — ED Triage Notes (Signed)
 Pt c/o chest pain that started suddenly one hour ago. Denies any hx. Also c/o of a migraine.

## 2023-01-13 NOTE — ED Provider Notes (Signed)
 [place] EMERGENCY DEPARTMENT AT [facility] Provider Note   CSN: [id number] Arrival date & time: 01/13/23  0900     History  Chief Complaint  Patient presents with   Chest Pain    [name] [name] is a 28 y.o. male.  Presents the ER with central chest pain radiating to left scapular area since this morning upon waking up, it is worse with trying to straighten his upper back and with movement and palpation, no shortness of breath, no pleurisy, no nausea or vomiting, no swelling.  Patient has no past medical history.  He is not a smoker.  Does report family history of heart disease.  Pain is not worse with exertion, no sweating, diaphoresis, palpitations, no fevers or chills.     Chest Pain      Home Medications Prior to Admission medications   Medication Sig Start Date End Date Taking? Authorizing Provider  methocarbamol  (ROBAXIN ) 500 MG tablet Take 1 tablet (500 mg total) by mouth every 6 (six) hours as needed for muscle spasms. 01/13/23  Yes Williams Dietrick A, PA-C  naproxen  (NAPROSYN ) 500 MG tablet Take 1 tablet (500 mg total) by mouth 2 (two) times daily. 01/13/23  Yes Makhai Fulco A, PA-C  amoxicillin  (AMOXIL ) 500 MG capsule Take 1 capsule (500 mg total) by mouth 3 (three) times daily. 02/22/17   Mesner, Selinda, MD  erythromycin  ophthalmic ointment Place a 1/2 inch ribbon of ointment into the lower eyelid 3 times daily x 1 week 02/17/20   Carita Senior, MD  naproxen  (NAPROSYN ) 500 MG tablet Take 1 tablet (500 mg total) by mouth 2 (two) times daily as needed. 02/17/20   Rancour, Senior, MD  ondansetron  (ZOFRAN  ODT) 4 MG disintegrating tablet Take 1 tablet (4 mg total) by mouth every 8 (eight) hours as needed for nausea or vomiting. 12/07/16   Joyice Sauer, DO      Allergies    Promethazine  hcl and Vicodin [hydrocodone-acetaminophen]    Review of Systems   Review of Systems  Cardiovascular:  Positive for chest pain.    Physical Exam Updated Vital  Signs BP 121/85 (BP Location: Right Arm)   Pulse 68   Temp 98.2 F (36.8 C) (Oral)   Resp 16   Ht 5' 10 (1.778 m)   Wt 61.2 kg   SpO2 99%   BMI 19.37 kg/m  Physical Exam Vitals and nursing note reviewed.  Constitutional:      General: He is not in acute distress.    Appearance: He is well-developed.  HENT:     Head: Normocephalic and atraumatic.  Eyes:     Conjunctiva/sclera: Conjunctivae normal.  Cardiovascular:     Rate and Rhythm: Normal rate and regular rhythm.     Heart sounds: No murmur heard. Pulmonary:     Effort: Pulmonary effort is normal. No respiratory distress.     Breath sounds: Normal breath sounds.  Abdominal:     Palpations: Abdomen is soft.     Tenderness: There is no abdominal tenderness.  Musculoskeletal:        General: No swelling.     Cervical back: Neck supple.     Right lower leg: No edema.     Left lower leg: No edema.     Comments: Producible tenderness to left parasternal area and left paraspinous area with no crepitus,  no overlying skin changes.  Skin:    General: Skin is warm and dry.     Capillary Refill: Capillary refill takes  less than 2 seconds.  Neurological:     Mental Status: He is alert.  Psychiatric:        Mood and Affect: Mood normal.     ED Results / Procedures / Treatments   Labs (all labs ordered are listed, but only abnormal results are displayed) Labs Reviewed  BASIC METABOLIC PANEL  CBC  TROPONIN I (HIGH SENSITIVITY)    EKG None  Radiology DG Chest 2 View Result Date: 01/13/2023 CLINICAL DATA:  chest pain EXAM: CHEST - 2 VIEW COMPARISON:  None Available. FINDINGS: No pleural effusion. No pneumothorax. No focal airspace opacity. No radiographically apparent displaced rib fractures. Visualized upper abdomen is unremarkable. Vertebral body heights are maintained. IMPRESSION: No focal airspace opacity. Electronically Signed   By: Lyndall Gore M.D.   On: 01/13/2023 09:45    Procedures Procedures     Medications Ordered in ED Medications  lidocaine  (LIDODERM ) 5 % 1 patch (1 patch Transdermal Not Given 01/13/23 1325)  ketorolac  (TORADOL ) injection 15 mg (15 mg Intramuscular Given 01/13/23 1328)    ED Course/ Medical Decision Making/ A&P                                 Medical Decision Making The patient presented today for chest pain that had started this morning upon waking up. EKG showed normal sinus rhythm with no ischemic changes, Chest Xray was independently reviewed by me and shows no pneumothorax, no pulmonary edema or infiltrate. I agree with radiology interpretation.   They were given Toradol  for their symptoms, and on repeat evaluation their symptoms are improved  I considered a broad differential including but not limited to ACS, PE, Dissection, pneumothorax, costochondritis, pneumonia, GERD, pericarditis, and pericardial effusion.  PE is considered low risk and ruled out by [facility] criteria all negative  I feel disssection is extremely unlikely  Symptoms and EKG are not consisted with pericarditis  Their heart score is 0.  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  Advised on NSAIDs, rest and strict return precautions.   Amount and/or Complexity of Data Reviewed Labs: ordered. Radiology: ordered.  Risk Prescription drug management.           Final Clinical Impression(s) / ED Diagnoses Final diagnoses:  Chest wall pain    Rx / DC Orders ED Discharge Orders          Ordered    naproxen  (NAPROSYN ) 500 MG tablet  2 times daily        01/13/23 1407    methocarbamol  (ROBAXIN ) 500 MG tablet  Every 6 hours PRN        01/13/23 [address] PA-C 01/13/23 1845    Suzette Pac, MD 01/17/23 1500

## 2023-01-13 NOTE — Discharge Instructions (Addendum)
 Care of you today.  You are seen in the ER for chest and upper back pain.  Your workup was reassuring, everything looked normal with your heart and lungs blood work and imaging and EKG.  We are treating you with anti-inflammatories and muscle relaxers as this seems like a musculoskeletal issue possibly due to a pulled muscle at work.  Try to rest, take the medication as prescribed and follow-up closely with primary care.  Come back to the ER if you have worsening pain, shortness of breath, sweating or dizziness or other worrisome changes.  [facility] Primary Care Doctor List    Rollene Pesa, MD. Specialty: [facility] Medicine Contact information: [address]  [place] [address]  [phone]   Glendia Fielding, MD. Specialty: [facility] Medicine Contact information: [address]  [place] [address]  [phone]   Benita Outhouse, MD Specialty: Internal Medicine Contact information: [address] [place] [address]  [phone]   Darlyn Hurst, MD. Specialty: Internal Medicine Contact information: [address]  [place] [address]  [phone]    [facility] (Dr. Luke) Specialty: Family Medicine Contact information: [address]  [place] [address]  [phone]   Garnette Lolling, MD. Specialty: [facility] Medicine Contact information: [address]  [address]  [place] [address]  [phone]   Gaither Langton, MD. Specialty: Internal Medicine Contact information: [address]  [address]  [place] [address]  [phone]    [facility]  [address] [place], [place] [address] [phone]  Services [facility] offers a variety of basic health services.  Services include but are not limited to: Blood pressure checks  Heart rate checks  Blood sugar checks  Urine analysis  Rapid strep tests  Pregnancy tests.  Health education and  referrals  People needing more complex services will be directed to a physician online. Using these virtual visits, doctors can evaluate and prescribe medicine and treatments. There will be no medication on-site, though [facility] will help patients fill their prescriptions at little to no cost.   For More information please go to: [email or web address]

## 2024-02-07 ENCOUNTER — Ambulatory Visit: Payer: Self-pay | Admitting: *Deleted

## 2024-02-07 NOTE — Telephone Encounter (Signed)
" ° °  FYI Only or Action Required?: FYI only for provider: recommended UC due to no PCP, appt for new patient scheduled for Friday .  Patient was last seen in primary care on no PCP.  Called Nurse Triage reporting Back Pain.  Symptoms began several years ago.  Interventions attempted: OTC medications: reports tried multiple  and Prescription medications: see chart, has tried multiple medications with no relief.  Symptoms are: unchanged.  Triage Disposition: See PCP Within 2 Weeks  Patient/caregiver understands and will follow disposition?: Yes                 Reason for Disposition  Back pain is a chronic symptom (recurrent or ongoing AND present > 4 weeks)  Answer Assessment - Initial Assessment Questions Recommended UC for pain and itching until seen by new provider on Friday. Patient requesting for referral for sx due to no one can figure out what is wrong or what causes sx.     1. ONSET: When did the pain begin? (e.g., minutes, hours, days)     2-3 years  2. LOCATION: Where does it hurt? (upper, mid or lower back)     Low back , neck  3. SEVERITY: How bad is the pain?  (e.g., Scale 1-10; mild, moderate, or severe)     Moderate  4. PATTERN: Is the pain constant? (e.g., yes, no; constant, intermittent)      Chronic issues 5. RADIATION: Does the pain shoot into your legs or somewhere else?     No  6. CAUSE:  What do you think is causing the back pain?      job 7. BACK OVERUSE:  Any recent lifting of heavy objects, strenuous work or exercise?     unsure 8. MEDICINES: What have you taken so far for the pain? (e.g., nothing, acetaminophen, NSAIDS)     Did not report taking medications. Reports he does not like to take any medications. 9. NEUROLOGIC SYMPTOMS: Do you have any weakness, numbness, or problems with bowel/bladder control?     No  10. OTHER SYMPTOMS: Do you have any other symptoms? (e.g., fever, abdomen pain, burning with  urination, blood in urine)       Low back pain and neck pain chronic. Can do normal activities and work but has pain while doing activities. Chronic itching all over body , hives at times and has tried multiple medications with no relief. Denies fever no rash now , does scratch at times until bleeding. No issues with urination no chest pain no difficulty breathing reported.  Protocols used: Back Pain-A-AH  "

## 2024-02-09 DIAGNOSIS — G43109 Migraine with aura, not intractable, without status migrainosus: Secondary | ICD-10-CM | POA: Insufficient documentation

## 2024-02-09 DIAGNOSIS — F329 Major depressive disorder, single episode, unspecified: Secondary | ICD-10-CM | POA: Insufficient documentation

## 2024-02-09 DIAGNOSIS — F319 Bipolar disorder, unspecified: Secondary | ICD-10-CM | POA: Insufficient documentation

## 2024-02-10 ENCOUNTER — Ambulatory Visit: Admitting: Sports Medicine

## 2024-02-10 ENCOUNTER — Encounter: Payer: Self-pay | Admitting: Sports Medicine

## 2024-02-10 VITALS — BP 138/89 | HR 86 | Temp 98.0°F | Ht 70.0 in | Wt 143.0 lb

## 2024-02-10 DIAGNOSIS — L299 Pruritus, unspecified: Secondary | ICD-10-CM

## 2024-02-10 DIAGNOSIS — L509 Urticaria, unspecified: Secondary | ICD-10-CM

## 2024-02-10 DIAGNOSIS — M2550 Pain in unspecified joint: Secondary | ICD-10-CM

## 2024-02-10 DIAGNOSIS — Z113 Encounter for screening for infections with a predominantly sexual mode of transmission: Secondary | ICD-10-CM

## 2024-02-10 NOTE — Patient Instructions (Signed)

## 2024-02-10 NOTE — Progress Notes (Unsigned)
 "  Careteam: Patient Care Team: Sherlynn Madden, MD as PCP - General (Internal Medicine)  Allergies[1]  Chief Complaint  Patient presents with   Establish Care    New pt. He states he's been having all over itching for about 5 years now. He's seen a dermatologist and allergy specalist. He states his mom has an auto immune disease and he wonders if he may have it     Discussed the use of AI scribe software for clinical note transcription with the patient, who gave verbal consent to proceed.  History of Present Illness      Review of Systems:  Review of Systems  Constitutional:  Positive for malaise/fatigue.  Skin:  Positive for itching.   Negative unless indicated in HPI.   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 Social History[2] Family History  Problem Relation Age of Onset   Depression Mother    Bipolar disorder Father    ADD / ADHD Brother    Depression Maternal Grandmother    Allergies[3]  Medications: Patient's Medications  New Prescriptions   No medications on file  Previous Medications   AMOXICILLIN  (AMOXIL ) 500 MG CAPSULE    Take 1 capsule (500 mg total) by mouth 3 (three) times daily.   CETIRIZINE (ZYRTEC) 10 MG TABLET    Take 10 mg by mouth.   ERYTHROMYCIN  OPHTHALMIC OINTMENT    Place a 1/2 inch ribbon of ointment into the lower eyelid 3 times daily x 1 week   METHOCARBAMOL  (ROBAXIN ) 500 MG TABLET    Take 1 tablet (500 mg total) by mouth every 6 (six) hours as  needed for muscle spasms.   NAPROXEN  (NAPROSYN ) 500 MG TABLET    Take 1 tablet (500 mg total) by mouth 2 (two) times daily as needed.   NAPROXEN  (NAPROSYN ) 500 MG TABLET    Take 1 tablet (500 mg total) by mouth 2 (two) times daily.   ONDANSETRON  (ZOFRAN  ODT) 4 MG DISINTEGRATING TABLET    Take 1 tablet (4 mg total) by mouth every 8 (eight) hours as needed for nausea or vomiting.   POLYETHYLENE GLYCOL POWDER (GLYCOLAX/MIRALAX) 17 GM/SCOOP POWDER    17 gram Orally Once a day; Duration: 30 day(s)   RIMEGEPANT SULFATE (NURTEC) 75 MG TBDP    1 tablet on the tongue and allow to dissolve Orally  Modified Medications   No medications on file  Discontinued Medications   No medications on file    Physical Exam: Vitals:   02/10/24 1422  BP: (!) 142/91  Pulse: 86  Temp: 98 F (36.7 C)  TempSrc: Oral  SpO2: 97%  Weight: 143 lb (64.9 kg)  Height: 5' 10 (1.778 m)   Body mass index is 20.52 kg/m. BP Readings from Last 3 Encounters:  02/10/24 (!) 142/91  01/13/23 121/85  02/17/20 125/88   Wt Readings from Last 3 Encounters:  02/10/24 143 lb (64.9 kg)  01/13/23 135 lb (61.2 kg)  02/16/20 130 lb (59 kg)    Physical Exam Constitutional:  Appearance: Normal appearance.  HENT:     Head: Normocephalic and atraumatic.     Mouth/Throat:     Pharynx: No posterior oropharyngeal erythema.  Cardiovascular:     Rate and Rhythm: Normal rate and regular rhythm.     Pulses: Normal pulses.     Heart sounds: Normal heart sounds.  Pulmonary:     Effort: No respiratory distress.     Breath sounds: No stridor. No wheezing or rales.  Abdominal:     General: Bowel sounds are normal. There is no distension.     Palpations: Abdomen is soft.     Tenderness: There is no abdominal tenderness. There is no guarding.  Musculoskeletal:        General: No swelling.     Comments: SLR neg  ROM intact  Hip - rom intact  Skin:    Comments:  Tiny red bumps on his back  No hives  No excoriations    Neurological:     Mental Status: He is alert. Mental status is at baseline.     Sensory: No sensory deficit.     Motor: No weakness.     Labs reviewed: Basic Metabolic Panel: No results for input(s): NA, K, CL, CO2, GLUCOSE, BUN, CREATININE, CALCIUM, MG, PHOS, TSH in the last 8760 hours. Liver Function Tests: No results for input(s): AST, ALT, ALKPHOS, BILITOT, PROT, ALBUMIN in the last 8760 hours. No results for input(s): LIPASE, AMYLASE in the last 8760 hours. No results for input(s): AMMONIA in the last 8760 hours. CBC: No results for input(s): WBC, NEUTROABS, HGB, HCT, MCV, PLT in the last 8760 hours. Lipid Panel: No results for input(s): CHOL, HDL, LDLCALC, TRIG, CHOLHDL, LDLDIRECT in the last 8760 hours. TSH: No results for input(s): TSH in the last 8760 hours. A1C: No results found for: HGBA1C  Assessment & Plan Polyarthralgia C/o multiple joint pains C/o morning stiffness No redness or swelling of joints Orders:   Lupus anticoagulant panel( [facility])   Sedimentation rate  Pruritus  Orders:   CBC with Differential/Platelet   Comp Met (CMET)   Mitochondrial Antibodies   Antinuclear Antib (ANA)   Lupus anticoagulant panel( [facility])   TSH   Iron, TIBC and Ferritin Panel  Urticaria  Orders:   Hepatitis C antibody   CBC with Differential/Platelet   Comp Met (CMET)   Antinuclear Antib (ANA)  Screening examination for STD (sexually transmitted disease)  Orders:   HIV Antibody (routine testing w rflx)   Hepatitis C antibody   No follow-ups on file.:   Lorane Cousar    [1]  Allergies Allergen Reactions   Promethazine  Hcl Other (See Comments)    Anxiety, feeling restless, muscle cramping   Vicodin [Hydrocodone-Acetaminophen]   [2]  Social History Tobacco Use   Smoking status: Every Day    Current packs/day: 0.50    Types:  Cigarettes   Smokeless tobacco: Current   Tobacco comments:    Vapes   Vaping Use   Vaping status: Every Day  Substance Use Topics   Alcohol use: Yes    Comment: occasionally   Drug use: No    Types: Marijuana    Comment: last one 3 days  [3]  Allergies Allergen Reactions   Promethazine  Hcl Other (See Comments)    Anxiety, feeling restless, muscle cramping   Vicodin [Hydrocodone-Acetaminophen]    "
